# Patient Record
Sex: Female | Born: 1965 | Race: White | Hispanic: No | Marital: Married | Smoking: Never smoker
Health system: Southern US, Community
[De-identification: ages and names within clinical notes are randomized; demographics above are authoritative.]

## PROBLEM LIST (undated history)

## (undated) DIAGNOSIS — I10 Essential (primary) hypertension: Secondary | ICD-10-CM

---

## 2014-07-12 ENCOUNTER — Other Ambulatory Visit: Payer: Self-pay

## 2014-07-12 DIAGNOSIS — Z1231 Encounter for screening mammogram for malignant neoplasm of breast: Secondary | ICD-10-CM

## 2014-07-21 ENCOUNTER — Ambulatory Visit
Admission: RE | Admit: 2014-07-21 | Discharge: 2014-07-21 | Disposition: A | Discharge status: Home / Self Care | Payer: BC Managed Care – PPO | Source: Ambulatory Visit

## 2014-07-21 DIAGNOSIS — Z1231 Encounter for screening mammogram for malignant neoplasm of breast: Secondary | ICD-10-CM

## 2015-06-23 ENCOUNTER — Other Ambulatory Visit: Payer: Self-pay

## 2015-06-23 DIAGNOSIS — Z803 Family history of malignant neoplasm of breast: Secondary | ICD-10-CM

## 2015-06-23 DIAGNOSIS — Z1231 Encounter for screening mammogram for malignant neoplasm of breast: Secondary | ICD-10-CM

## 2015-08-02 ENCOUNTER — Ambulatory Visit
Admission: RE | Admit: 2015-08-02 | Discharge: 2015-08-02 | Disposition: A | Discharge status: Home / Self Care | Payer: BLUE CROSS/BLUE SHIELD | Source: Ambulatory Visit

## 2015-08-02 DIAGNOSIS — Z1231 Encounter for screening mammogram for malignant neoplasm of breast: Secondary | ICD-10-CM

## 2015-08-02 DIAGNOSIS — Z803 Family history of malignant neoplasm of breast: Secondary | ICD-10-CM

## 2016-06-25 ENCOUNTER — Other Ambulatory Visit: Payer: Self-pay | Admitting: Family Medicine

## 2016-06-25 DIAGNOSIS — Z1231 Encounter for screening mammogram for malignant neoplasm of breast: Secondary | ICD-10-CM

## 2016-07-12 ENCOUNTER — Encounter (HOSPITAL_COMMUNITY)
Admission: EM | Disposition: A | Discharge status: Home / Self Care | Payer: Self-pay | Source: Home / Self Care | Attending: Neurosurgery

## 2016-07-12 ENCOUNTER — Emergency Department (HOSPITAL_COMMUNITY): Payer: BLUE CROSS/BLUE SHIELD

## 2016-07-12 ENCOUNTER — Other Ambulatory Visit (HOSPITAL_COMMUNITY): Payer: Self-pay

## 2016-07-12 ENCOUNTER — Inpatient Hospital Stay (HOSPITAL_COMMUNITY)
Admission: EM | Admit: 2016-07-12 | Discharge: 2016-07-21 | DRG: 066 | Disposition: A | Discharge status: Home / Self Care | Payer: BLUE CROSS/BLUE SHIELD | Attending: Neurosurgery | Admitting: Neurosurgery

## 2016-07-12 ENCOUNTER — Inpatient Hospital Stay (HOSPITAL_COMMUNITY): Payer: BLUE CROSS/BLUE SHIELD

## 2016-07-12 ENCOUNTER — Encounter (HOSPITAL_COMMUNITY): Payer: Self-pay | Admitting: Nurse Practitioner

## 2016-07-12 ENCOUNTER — Inpatient Hospital Stay (HOSPITAL_COMMUNITY): Payer: BLUE CROSS/BLUE SHIELD | Admitting: Certified Registered Nurse Anesthetist

## 2016-07-12 DIAGNOSIS — R51 Headache: Secondary | ICD-10-CM | POA: Diagnosis present

## 2016-07-12 DIAGNOSIS — I10 Essential (primary) hypertension: Secondary | ICD-10-CM | POA: Diagnosis present

## 2016-07-12 DIAGNOSIS — S066X0A Traumatic subarachnoid hemorrhage without loss of consciousness, initial encounter: Secondary | ICD-10-CM

## 2016-07-12 DIAGNOSIS — I609 Nontraumatic subarachnoid hemorrhage, unspecified: Secondary | ICD-10-CM | POA: Diagnosis present

## 2016-07-12 HISTORY — PX: IR GENERIC HISTORICAL: IMG1180011

## 2016-07-12 HISTORY — DX: Essential (primary) hypertension: I10

## 2016-07-12 HISTORY — PX: RADIOLOGY WITH ANESTHESIA: SHX6223

## 2016-07-12 LAB — RAPID URINE DRUG SCREEN, HOSP PERFORMED
AMPHETAMINES: NOT DETECTED
Barbiturates: NOT DETECTED
Benzodiazepines: POSITIVE — AB
Cocaine: NOT DETECTED
Opiates: NOT DETECTED
Tetrahydrocannabinol: NOT DETECTED

## 2016-07-12 LAB — CBC
HCT: 38.1 % (ref 36.0–46.0)
HCT: 39.1 % (ref 36.0–46.0)
Hemoglobin: 13.6 g/dL (ref 12.0–15.0)
Hemoglobin: 13.5 g/dL (ref 12.0–15.0)
MCH: 33.3 pg (ref 26.0–34.0)
MCH: 32.5 pg (ref 26.0–34.0)
MCHC: 35.7 g/dL (ref 30.0–36.0)
MCHC: 34.5 g/dL (ref 30.0–36.0)
MCV: 93.2 fL (ref 78.0–100.0)
MCV: 94.2 fL (ref 78.0–100.0)
PLATELETS: 197 10*3/uL (ref 150–400)
PLATELETS: 194 10*3/uL (ref 150–400)
RBC: 4.09 MIL/uL (ref 3.87–5.11)
RBC: 4.15 MIL/uL (ref 3.87–5.11)
RDW: 13.4 % (ref 11.5–15.5)
RDW: 13.5 % (ref 11.5–15.5)
WBC: 8.7 10*3/uL (ref 4.0–10.5)
WBC: 10.6 10*3/uL — AB (ref 4.0–10.5)

## 2016-07-12 LAB — COMPREHENSIVE METABOLIC PANEL
ALT: 38 U/L (ref 14–54)
AST: 41 U/L (ref 15–41)
Albumin: 4.6 g/dL (ref 3.5–5.0)
Alkaline Phosphatase: 79 U/L (ref 38–126)
Anion gap: 13 (ref 5–15)
BUN: 15 mg/dL (ref 6–20)
CHLORIDE: 104 mmol/L (ref 101–111)
CO2: 22 mmol/L (ref 22–32)
CREATININE: 0.92 mg/dL (ref 0.44–1.00)
Calcium: 10.1 mg/dL (ref 8.9–10.3)
GFR calc non Af Amer: >60 mL/min (ref >60)
Glucose, Bld: 156 mg/dL — ABNORMAL HIGH (ref 65–99)
POTASSIUM: 3.2 mmol/L — AB (ref 3.5–5.1)
SODIUM: 139 mmol/L (ref 135–145)
Total Bilirubin: 0.5 mg/dL (ref 0.3–1.2)
Total Protein: 7.6 g/dL (ref 6.5–8.1)

## 2016-07-12 LAB — APTT: APTT: 28 s (ref 24–36)

## 2016-07-12 LAB — PROTIME-INR
INR: 1
Prothrombin Time: 13.2 seconds (ref 11.4–15.2)

## 2016-07-12 LAB — LIPASE, BLOOD: LIPASE: 39 U/L (ref 11–51)

## 2016-07-12 SURGERY — RADIOLOGY WITH ANESTHESIA
Anesthesia: Choice

## 2016-07-12 SURGERY — Surgical Case
Anesthesia: *Unknown

## 2016-07-12 MED ORDER — PHENYLEPHRINE 40 MCG/ML (10ML) SYRINGE FOR IV PUSH (FOR BLOOD PRESSURE SUPPORT)
PREFILLED_SYRINGE | INTRAVENOUS | Status: AC
  Filled 2016-07-12: qty 10

## 2016-07-12 MED ORDER — ACETAMINOPHEN 160 MG/5ML PO SOLN: 650.0000 mg | ORAL | Status: DC | PRN

## 2016-07-12 MED ORDER — NICARDIPINE HCL IN NACL 20-0.86 MG/200ML-% IV SOLN: 0.0000 mg/h | INTRAVENOUS | Status: DC

## 2016-07-12 MED ORDER — ACETAMINOPHEN-CODEINE #3 300-30 MG PO TABS
1.0000 | ORAL_TABLET | ORAL | Status: DC | PRN
  Administered 2016-07-12 – 2016-07-20 (×8): 2 via ORAL
  Filled 2016-07-12 (×8): qty 2

## 2016-07-12 MED ORDER — PANTOPRAZOLE SODIUM 40 MG PO PACK: 40.0000 mg | PACK | Freq: Every day | ORAL | Status: DC

## 2016-07-12 MED ORDER — PANTOPRAZOLE SODIUM 40 MG PO TBEC
40.0000 mg | DELAYED_RELEASE_TABLET | Freq: Every day | ORAL | Status: DC
  Administered 2016-07-12 – 2016-07-21 (×10): 40 mg via ORAL
  Filled 2016-07-12 (×10): qty 1

## 2016-07-12 MED ORDER — PROPOFOL 10 MG/ML IV BOLUS
INTRAVENOUS | Status: AC
  Filled 2016-07-12: qty 20

## 2016-07-12 MED ORDER — MORPHINE SULFATE (PF) 4 MG/ML IV SOLN: 1.0000 mg | INTRAVENOUS | Status: DC | PRN

## 2016-07-12 MED ORDER — LIDOCAINE 2% (20 MG/ML) 5 ML SYRINGE
INTRAMUSCULAR | Status: AC
  Filled 2016-07-12: qty 5

## 2016-07-12 MED ORDER — FENTANYL CITRATE (PF) 100 MCG/2ML IJ SOLN
INTRAMUSCULAR | Status: DC | PRN
  Administered 2016-07-12: 25 ug via INTRAVENOUS

## 2016-07-12 MED ORDER — ONDANSETRON HCL 4 MG/2ML IJ SOLN
4.0000 mg | Freq: Four times a day (QID) | INTRAMUSCULAR | Status: DC | PRN
Start: 2016-07-12 — End: 2016-07-21
  Administered 2016-07-13 – 2016-07-20 (×7): 4 mg via INTRAVENOUS
  Filled 2016-07-12 (×7): qty 2

## 2016-07-12 MED ORDER — SUCCINYLCHOLINE CHLORIDE 200 MG/10ML IV SOSY
PREFILLED_SYRINGE | INTRAVENOUS | Status: AC
  Filled 2016-07-12: qty 10

## 2016-07-12 MED ORDER — PROCHLORPERAZINE EDISYLATE 5 MG/ML IJ SOLN
10.0000 mg | Freq: Once | INTRAMUSCULAR | Status: AC
  Administered 2016-07-12: 10 mg via INTRAVENOUS
  Filled 2016-07-12: qty 2

## 2016-07-12 MED ORDER — IOPAMIDOL (ISOVUE-300) INJECTION 61%
INTRAVENOUS | Status: AC
  Administered 2016-07-12: 75 mL
  Filled 2016-07-12: qty 100

## 2016-07-12 MED ORDER — MIDAZOLAM HCL 2 MG/2ML IJ SOLN
INTRAMUSCULAR | Status: DC | PRN
  Administered 2016-07-12: 1 mg via INTRAVENOUS

## 2016-07-12 MED ORDER — ACETAMINOPHEN 325 MG PO TABS
650.0000 mg | ORAL_TABLET | ORAL | Status: DC | PRN
  Administered 2016-07-17 – 2016-07-18 (×2): 650 mg via ORAL
  Filled 2016-07-12 (×2): qty 2

## 2016-07-12 MED ORDER — NIMODIPINE 30 MG PO CAPS
60.0000 mg | ORAL_CAPSULE | ORAL | Status: DC
  Administered 2016-07-12 – 2016-07-21 (×51): 60 mg via ORAL
  Filled 2016-07-12 (×53): qty 2

## 2016-07-12 MED ORDER — ROCURONIUM BROMIDE 10 MG/ML (PF) SYRINGE
PREFILLED_SYRINGE | INTRAVENOUS | Status: AC
  Filled 2016-07-12: qty 10

## 2016-07-12 MED ORDER — NIMODIPINE 60 MG/20ML PO SOLN
60.0000 mg | ORAL | Status: DC
  Filled 2016-07-12 (×2): qty 20

## 2016-07-12 MED ORDER — IOPAMIDOL (ISOVUE-370) INJECTION 76%
INTRAVENOUS | Status: AC
  Administered 2016-07-12: 50 mL
  Filled 2016-07-12: qty 50

## 2016-07-12 MED ORDER — IOPAMIDOL (ISOVUE-300) INJECTION 61%
INTRAVENOUS | Status: AC
  Filled 2016-07-12: qty 150

## 2016-07-12 MED ORDER — HYDROCHLOROTHIAZIDE 12.5 MG PO CAPS
12.5000 mg | ORAL_CAPSULE | Freq: Every day | ORAL | Status: DC
  Administered 2016-07-13 – 2016-07-21 (×9): 12.5 mg via ORAL
  Filled 2016-07-12 (×9): qty 1

## 2016-07-12 MED ORDER — FENTANYL CITRATE (PF) 100 MCG/2ML IJ SOLN
50.0000 ug | Freq: Once | INTRAMUSCULAR | Status: AC
  Administered 2016-07-12: 50 ug via INTRAVENOUS
  Filled 2016-07-12: qty 2

## 2016-07-12 MED ORDER — LABETALOL HCL 5 MG/ML IV SOLN
20.0000 mg | Freq: Once | INTRAVENOUS | Status: AC
  Administered 2016-07-12: 20 mg via INTRAVENOUS
  Filled 2016-07-12: qty 4

## 2016-07-12 MED ORDER — STROKE: EARLY STAGES OF RECOVERY BOOK
Freq: Once | Status: DC
  Filled 2016-07-12: qty 1

## 2016-07-12 MED ORDER — EPHEDRINE 5 MG/ML INJ
INTRAVENOUS | Status: AC
  Filled 2016-07-12: qty 10

## 2016-07-12 MED ORDER — SODIUM CHLORIDE 0.9 % IV BOLUS (SEPSIS)
500.0000 mL | Freq: Once | INTRAVENOUS | Status: AC
  Administered 2016-07-12: 500 mL via INTRAVENOUS

## 2016-07-12 MED ORDER — SODIUM CHLORIDE 0.9 % IV SOLN
INTRAVENOUS | Status: DC
  Administered 2016-07-12: 100 mL/h via INTRAVENOUS
  Administered 2016-07-12 – 2016-07-13 (×4): via INTRAVENOUS
  Administered 2016-07-14: 1000 mL via INTRAVENOUS
  Administered 2016-07-14: via INTRAVENOUS
  Administered 2016-07-15: 1000 mL via INTRAVENOUS
  Administered 2016-07-16: via INTRAVENOUS
  Administered 2016-07-16: 1000 mL via INTRAVENOUS
  Administered 2016-07-18 – 2016-07-20 (×6): via INTRAVENOUS
  Administered 2016-07-21: 100 mL/h via INTRAVENOUS

## 2016-07-12 MED ORDER — DIPHENHYDRAMINE HCL 50 MG/ML IJ SOLN
12.5000 mg | Freq: Once | INTRAMUSCULAR | Status: AC
  Administered 2016-07-12: 12.5 mg via INTRAVENOUS
  Filled 2016-07-12: qty 1

## 2016-07-12 MED ORDER — STROKE: EARLY STAGES OF RECOVERY BOOK
Freq: Once | Status: AC
  Administered 2016-07-12: 22:00:00
  Filled 2016-07-12: qty 1

## 2016-07-12 MED ORDER — ONDANSETRON HCL 4 MG/2ML IJ SOLN
4.0000 mg | Freq: Once | INTRAMUSCULAR | Status: AC
  Administered 2016-07-12: 4 mg via INTRAVENOUS

## 2016-07-12 MED ORDER — LACTATED RINGERS IV SOLN: INTRAVENOUS | Status: DC | PRN

## 2016-07-12 MED ORDER — LIDOCAINE HCL 1 % IJ SOLN
INTRAMUSCULAR | Status: AC
  Filled 2016-07-12: qty 20

## 2016-07-12 MED ORDER — ONDANSETRON HCL 4 MG/2ML IJ SOLN
INTRAMUSCULAR | Status: AC
  Filled 2016-07-12: qty 2

## 2016-07-12 MED ORDER — LOSARTAN POTASSIUM-HCTZ 100-12.5 MG PO TABS: 1.0000 | ORAL_TABLET | Freq: Every day | ORAL | Status: DC

## 2016-07-12 MED ORDER — DOCUSATE SODIUM 100 MG PO CAPS
100.0000 mg | ORAL_CAPSULE | Freq: Two times a day (BID) | ORAL | Status: DC
  Administered 2016-07-12 – 2016-07-21 (×15): 100 mg via ORAL
  Filled 2016-07-12 (×16): qty 1

## 2016-07-12 MED ORDER — ONDANSETRON 4 MG PO TBDP
4.0000 mg | ORAL_TABLET | Freq: Four times a day (QID) | ORAL | Status: DC | PRN
  Filled 2016-07-12: qty 1

## 2016-07-12 MED ORDER — LIDOCAINE HCL 1 % IJ SOLN
INTRAMUSCULAR | Status: AC | PRN
  Administered 2016-07-12: 5 mL

## 2016-07-12 MED ORDER — LOSARTAN POTASSIUM 50 MG PO TABS
100.0000 mg | ORAL_TABLET | Freq: Every day | ORAL | Status: DC
  Administered 2016-07-13 – 2016-07-21 (×9): 100 mg via ORAL
  Filled 2016-07-12 (×10): qty 2

## 2016-07-12 NOTE — ED Notes (Signed)
Neruosurgery at bedside

## 2016-07-12 NOTE — ED Notes (Signed)
Bed: WA06 Expected date:  Expected time:  Means of arrival:  Comments: 50 yo nausea/vomiting

## 2016-07-12 NOTE — Anesthesia Procedure Notes (Signed)
Procedure Name: MAC Date/Time: 07/12/2016 7:57 PM Performed by: Orvilla FusATO, Nela Bascom A Oxygen Delivery Method: Nasal cannula

## 2016-07-12 NOTE — ED Triage Notes (Addendum)
Eating reheated chinese food and started vomiting. EMS gave 4mg  of zofran prior to arrival

## 2016-07-12 NOTE — Transfer of Care (Signed)
Immediate Anesthesia Transfer of Care Note  Patient: Sherren KernsJennifer Kozub  Procedure(s) Performed: Procedure(s): RADIOLOGY WITH ANESTHESIA (N/A)  Patient Location: NICU  Anesthesia Type:MAC  Level of Consciousness: awake, alert  and oriented  Airway & Oxygen Therapy: Patient Spontanous Breathing  Post-op Assessment: Report given to RN, Post -op Vital signs reviewed and stable and Patient moving all extremities  Post vital signs: Reviewed and stable  Last Vitals:  Vitals:   07/12/16 1828 07/12/16 1900  BP:  168/100  Pulse:  79  Resp:  19  Temp: 37 C     Last Pain: There were no vitals filed for this visit.       Complications: No apparent anesthesia complications

## 2016-07-12 NOTE — Progress Notes (Signed)
Dr. Knapp in room with pt 

## 2016-07-12 NOTE — ED Notes (Signed)
[facility] here to get [facility]. Patient stable at this time. Report callled to Charge Nurse [facility]

## 2016-07-12 NOTE — H&P (Signed)
Carolyn Sharp is an 50 y.o. female.   Chief Complaint: headache, nausea, vomiting HPI: Carolyn Sharp was in her usual state of health when earlier today she reports a headache, nausea and subsequent emesis. Taken by ambulance to [facility], a head ct showed a large amount of subarachnoid blood.   Past Medical History:  Diagnosis Date  . Hypertension     No past surgical history on file.  No family history on file. Social History:  reports that she has never smoked. She has never used smokeless tobacco. She reports that she drinks about 4.2 oz of alcohol per week . She reports that she does not use drugs.  Allergies: No Known Allergies   (Not in a hospital admission)  Results for orders placed or performed during the hospital encounter of 07/12/16 (from the past 48 hour(s))  CBC     Status: None   Collection Time: 07/12/16  3:09 PM  Result Value Ref Range   WBC 8.7 4.0 - 10.5 K/uL   RBC 4.09 3.87 - 5.11 MIL/uL   Hemoglobin 13.6 12.0 - 15.0 g/dL   HCT 38.1 36.0 - 46.0 %   MCV 93.2 78.0 - 100.0 fL   MCH 33.3 26.0 - 34.0 pg   MCHC 35.7 30.0 - 36.0 g/dL   RDW 13.4 11.5 - 15.5 %   Platelets 197 150 - 400 K/uL  Comprehensive metabolic panel     Status: Abnormal   Collection Time: 07/12/16  3:09 PM  Result Value Ref Range   Sodium 139 135 - 145 mmol/L   Potassium 3.2 (L) 3.5 - 5.1 mmol/L   Chloride 104 101 - 111 mmol/L   CO2 22 22 - 32 mmol/L   Glucose, Bld 156 (H) 65 - 99 mg/dL   BUN 15 6 - 20 mg/dL   Creatinine, Ser 0.92 0.44 - 1.00 mg/dL   Calcium 10.1 8.9 - 10.3 mg/dL   Total Protein 7.6 6.5 - 8.1 g/dL   Albumin 4.6 3.5 - 5.0 g/dL   AST 41 15 - 41 U/L   ALT 38 14 - 54 U/L   Alkaline Phosphatase 79 38 - 126 U/L   Total Bilirubin 0.5 0.3 - 1.2 mg/dL   GFR calc non Af Amer >60 >60 mL/min   GFR calc Af Amer >60 >60 mL/min    Comment: (NOTE) The eGFR has been calculated using the CKD EPI equation. This calculation has not been validated in all clinical  situations. eGFR's persistently <60 mL/min signify possible Chronic Kidney Disease.    Anion gap 13 5 - 15  Lipase, blood     Status: None   Collection Time: 07/12/16  3:09 PM  Result Value Ref Range   Lipase 39 11 - 51 U/L   Ct Angio Head W Or Wo Contrast  Result Date: 07/12/2016 CLINICAL DATA:  Subarachnoid hemorrhage EXAM: CT ANGIOGRAPHY HEAD TECHNIQUE: Multidetector CT imaging of the head was performed using the standard protocol during bolus administration of intravenous contrast. Multiplanar CT image reconstructions and MIPs were obtained to evaluate the vascular anatomy. CONTRAST:  50 mL Isovue 370 IV COMPARISON:  None. FINDINGS: CTA HEAD Anterior circulation: Cavernous carotid widely patent. No significant atherosclerotic disease. Fetal origin of the posterior cerebral artery bilaterally. Negative for cavernous aneurysm. Anterior and middle cerebral arteries are normal without stenosis. Negative for aneurysm. Posterior circulation: Both vertebral arteries contribute to the basilar. PICA patent bilaterally. Right AICA patent. Basilar widely patent and hypoplastic distally. Superior cerebellar and posterior cerebral  arteries appear normal with. No aneurysm in the posterior circulation. Venous sinuses: Patent Anatomic variants: None Delayed phase: Diffuse subarachnoid hemorrhage which is symmetric and centered around the brainstem and suprasellar cistern. This also extends into the sylvian fissure bilaterally. This is unchanged from the CT earlier today. Early hydrocephalus again noted. IMPRESSION: Normal CTA head. No evidence of cerebral aneurysm or other cause for subarachnoid hemorrhage Moderate subarachnoid hemorrhage, greater than expected for venous hemorrhage. Close clinical follow-up and follow-up CTA recommended. Early hydrocephalus unchanged I reviewed the images with Dr. Cyndy Freeze. Electronically Signed   By: Franchot Gallo M.D.   On: 07/12/2016 17:18   Ct Head Wo Contrast  Result Date:  07/12/2016 CLINICAL DATA:  Acute onset headache with vomiting. EXAM: CT HEAD WITHOUT CONTRAST TECHNIQUE: Contiguous axial images were obtained from the base of the skull through the vertex without intravenous contrast. COMPARISON:  None. FINDINGS: Brain: There is subarachnoid hemorrhage extending throughout the suprasellar cistern region and into the surrounding cisternal regions. A small amount of subarachnoid hemorrhage extends to the left as far as the sylvian fissure. There is borderline ventricular prominence. There is no midline shift. There is no subdural or epidural fluid. There is no mass or edema evident. Vascular: The location of the hemorrhage suggests aneurysm, possibly arising from the basilar tip. Pilar Plate aneurysm is not delineated on this noncontrast enhanced study. Skull: The bony calvarium appears intact. Sinuses/Orbits: Visualized paranasal sinuses are clear. Visualized orbits appear symmetric bilaterally. Other: Mastoid air cells clear. IMPRESSION: Extensive subarachnoid hemorrhage. 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 Suspect early hydrocephalus. Critical Value/emergent results were called by telephone at the time of interpretation on 07/12/2016 at 3:22 pm to Dr. Dorie Rank , who verbally acknowledged these results. Electronically Signed   By: Lowella Grip III M.D.   On: 07/12/2016 15:24    Review of Systems  Constitutional: Negative.   HENT:       Headache  Eyes: Negative.   Respiratory: Negative.   Cardiovascular: Negative.   Gastrointestinal: Positive for nausea and vomiting.  Genitourinary: Negative.   Musculoskeletal: Negative.   Skin: Negative.   Neurological: Positive for headaches.  Endo/Heme/Allergies: Negative.   Psychiatric/Behavioral: Negative.     Blood pressure 184/94, pulse 77, temperature 98.6 F (37 C), resp. rate 17, height _0  (1.575 m), weight 63.5 kg (140 lb), SpO2 100 %. Physical Exam  Constitutional: She is oriented  to person, place, and time. She appears well-developed and well-nourished. She appears distressed.  HENT:  Head: Normocephalic and atraumatic.  Nose: Nose normal.  Mouth/Throat: Oropharynx is clear and moist.  Eyes: Conjunctivae and EOM are normal. Pupils are equal, round, and reactive to light.  Neck: Normal range of motion. Neck supple.  Cardiovascular: Normal rate, regular rhythm and normal heart sounds.   Respiratory: Effort normal and breath sounds normal.  GI: Soft.  Musculoskeletal: Normal range of motion.  Neurological: She is alert and oriented to person, place, and time. She has normal strength and normal reflexes. She displays normal reflexes. No cranial nerve deficit or sensory deficit. She exhibits normal muscle tone. Coordination normal. GCS eye subscore is 4. GCS verbal subscore is 5. GCS motor subscore is 6. She displays no Babinski's sign on the right side. She displays no Babinski's sign on the left side.  Reflex Scores:      Tricep reflexes are 2+ on the right side and 2+ on the left side.      Bicep reflexes are 2+ on  the right side and 2+ on the left side.      Brachioradialis reflexes are 2+ on the right side and 2+ on the left side.      Patellar reflexes are 2+ on the right side and 2+ on the left side.      Achilles reflexes are 2+ on the right side and 2+ on the left side. Alert and oriented x 4, speech is clear, fluent Moving all extremities well   Skin: Skin is warm and dry.  Psychiatric: She has a normal mood and affect. Her behavior is normal. Judgment and thought content normal.   has some repetition asking the same question and hearing the same explanation. Appropriate conversation.   Assessment/Plan Diagnostic angiogram tonight. CTA did not show an aneurysm. However due to the amount of blood I believe that a 4 vessel angiogram is warranted. She is doing well. Will admit to the ICU for close observation.  Non anuerysmal hemorrhage does occur in  20% of all  SAH. She is grade 1 on the Hunt Hess scale.   Eduardo Honor L, MD 07/12/2016, 5:41 PM

## 2016-07-12 NOTE — Anesthesia Preprocedure Evaluation (Signed)
Anesthesia Evaluation  Patient identified by MRN, date of birth, ID band Patient awake    Reviewed: Allergy & Precautions, NPO status , Patient's Chart, lab work & pertinent test results  Airway Mallampati: I  TM Distance: >3 FB Neck ROM: Full    Dental   Pulmonary    Pulmonary exam normal        Cardiovascular hypertension, Pt. on medications Normal cardiovascular exam     Neuro/Psych    GI/Hepatic   Endo/Other    Renal/GU      Musculoskeletal   Abdominal   Peds  Hematology   Anesthesia Other Findings   Reproductive/Obstetrics                             Anesthesia Physical Anesthesia Plan  ASA: III and emergent  Anesthesia Plan: MAC   Post-op Pain Management:    Induction: Intravenous  Airway Management Planned:   Additional Equipment:   Intra-op Plan:   Post-operative Plan:   Informed Consent: I have reviewed the patients History and Physical, chart, labs and discussed the procedure including the risks, benefits and alternatives for the proposed anesthesia with the patient or authorized representative who has indicated his/her understanding and acceptance.     Plan Discussed with: CRNA and Surgeon  Anesthesia Plan Comments:         Anesthesia Quick Evaluation

## 2016-07-12 NOTE — ED Provider Notes (Signed)
[facility]-EMERGENCY DEPT Provider Note   CSN: [id number] Arrival date & time: 07/12/16  1334     History   Chief Complaint No chief complaint on file.   HPI Carolyn Sharp is a 50 y.o. female.  Pt was at home.  She felt well initially.  Suddenly while eating some food, she started having nausea, vomiting,   She at the same food yesterday.  She also developed a severe headache in the back of her head.  Her neck was sore as well.    No photophobia.  No numbness or weakness.  No history of having trouble with headaches in the past.   The history is provided by the patient.  Emesis   This is a new problem. The current episode started 1 to 2 hours ago. The problem occurs 5 to 10 times per day. The problem has not changed since onset.There has been no fever. Associated symptoms include chills and sweats. Pertinent negatives include no fever.    Past Medical History:  Diagnosis Date  . Hypertension     There are no active problems to display for this patient.   No past surgical history on file.  OB History    No data available       Home Medications    Prior to Admission medications   Medication Sig Start Date End Date Taking? Authorizing Provider  acetaminophen (TYLENOL) 325 MG tablet Take 650 mg by mouth every 6 (six) hours as needed (For pain.).   Yes Historical Provider, MD  losartan-hydrochlorothiazide (HYZAAR) 100-12.5 MG tablet Take 1 tablet by mouth daily. 07/04/16  Yes Historical Provider, MD    Family History No family history on file.  Social History Social History  Substance Use Topics  . Smoking status: Never Smoker  . Smokeless tobacco: Never Used  . Alcohol use 4.2 oz/week    7 Glasses of wine per week     Allergies   Patient has no known allergies.   Review of Systems Review of Systems  Constitutional: Positive for chills. Negative for fever.  Gastrointestinal: Positive for vomiting.  All other systems reviewed and are  negative.    Physical Exam Updated Vital Signs BP 162/78   Pulse 88   Resp 20   Ht 5\' 2"  (1.575 m)   Wt 63.5 kg   BMI 25.61 kg/m   Physical Exam  Constitutional: She is oriented to person, place, and time. She appears well-developed and well-nourished. No distress.  HENT:  Head: Normocephalic and atraumatic.  Right Ear: External ear normal.  Left Ear: External ear normal.  Mouth/Throat: Oropharynx is clear and moist.  Eyes: Conjunctivae and EOM are normal. Pupils are equal, round, and reactive to light. Right eye exhibits no discharge. Left eye exhibits no discharge. No scleral icterus.  Neck: Neck supple. No tracheal deviation present.  Pain with flexion of the neck   Cardiovascular: Normal rate, regular rhythm and intact distal pulses.   Pulmonary/Chest: Effort normal and breath sounds normal. No stridor. No respiratory distress. She has no wheezes. She has no rales.  Abdominal: Soft. Bowel sounds are normal. She exhibits no distension. There is no tenderness. There is no rebound and no guarding.  Musculoskeletal: She exhibits no edema or tenderness.  Neurological: She is alert and oriented to person, place, and time. She has normal strength. No cranial nerve deficit (No facial droop, extraocular movements intact, tongue midline ) or sensory deficit. She exhibits normal muscle tone. She displays no seizure activity. Coordination normal.  No pronator drift bilateral upper extrem, able to hold both legs off bed for 5 seconds, sensation intact in all extremities, no visual field cuts, no left or right sided neglect, , no nystagmus noted   Skin: Skin is warm and dry. No rash noted.  Psychiatric: She has a normal mood and affect.  Nursing note and vitals reviewed.    ED Treatments / Results  Labs (all labs ordered are listed, but only abnormal results are displayed) Labs Reviewed  COMPREHENSIVE METABOLIC PANEL - Abnormal; Notable for the following:       Result Value    Potassium 3.2 (*)    Glucose, Bld 156 (*)    All other components within normal limits  CBC  LIPASE, BLOOD  URINALYSIS, ROUTINE W REFLEX MICROSCOPIC (NOT AT [facility])     Radiology Ct Head Wo Contrast  Result Date: 07/12/2016 CLINICAL DATA:  Acute onset headache with vomiting. EXAM: CT HEAD WITHOUT CONTRAST TECHNIQUE: Contiguous axial images were obtained from the base of the skull through the vertex without intravenous contrast. COMPARISON:  None. FINDINGS: Brain: There is subarachnoid hemorrhage extending throughout the suprasellar cistern region and into the surrounding cisternal regions. A small amount of subarachnoid hemorrhage extends to the left as far as the sylvian fissure. There is borderline ventricular prominence. There is no midline shift. There is no subdural or epidural fluid. There is no mass or edema evident. Vascular: The location of the hemorrhage suggests aneurysm, possibly arising from the basilar tip. Homero FellersFrank aneurysm is not delineated on this noncontrast enhanced study. Skull: The bony calvarium appears intact. Sinuses/Orbits: Visualized paranasal sinuses are clear. Visualized orbits appear symmetric bilaterally. Other: Mastoid air cells clear. IMPRESSION: Extensive subarachnoid hemorrhage. 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 Suspect early hydrocephalus. Critical Value/emergent results were called by telephone at the time of interpretation on 07/12/2016 at 3:22 pm to Dr. Linwood DibblesJON Agapita Savarino , who verbally acknowledged these results. Electronically Signed   By: Bretta BangWilliam  Woodruff III M.D.   On: 07/12/2016 15:24    Procedures .Critical Care Performed by: Linwood DibblesKNAPP, Marrisa Kimber Authorized by: Linwood DibblesKNAPP, Mandeep Kiser   Critical care provider statement:    Critical care time (minutes):  35   Critical care was necessary to treat or prevent imminent or life-threatening deterioration of the following conditions:  CNS failure or compromise   Critical care was time spent personally by me on  the following activities:  Discussions with consultants, evaluation of patient's response to treatment, examination of patient, ordering and performing treatments and interventions, ordering and review of laboratory studies, ordering and review of radiographic studies, pulse oximetry, re-evaluation of patient's condition, obtaining history from patient or surrogate and review of old charts   (including critical care time)  Medications Ordered in ED Medications  fentaNYL (SUBLIMAZE) injection 50 mcg (not administered)  ondansetron (ZOFRAN) injection 4 mg ( Intravenous Not Given 07/12/16 1432)  sodium chloride 0.9 % bolus 500 mL (500 mLs Intravenous New Bag/Given 07/12/16 1509)  prochlorperazine (COMPAZINE) injection 10 mg (10 mg Intravenous Given 07/12/16 1506)  diphenhydrAMINE (BENADRYL) injection 12.5 mg (12.5 mg Intravenous Given 07/12/16 1506)     Initial Impression / Assessment and Plan / ED Course  I have reviewed the triage vital signs and the nursing notes.  Pertinent labs & imaging results that were available during my care of the patient were reviewed by me and considered in my medical decision making (see chart for details).  Clinical Course as of Jul 12 1550  Thu Jul 12, 2016  1521 Preliminary review of the CT scan suggests SAH.  Formal read is pending.  [JK]  1547 Discussed with Dr Franky Machoabbell.  Will CT angio head.  [facility], hospitalization.  [JK]  1548 Neuro status unchanged. Patient remains alert and oriented. She feels better with her eyes closed but opens them quickly as soon as I speak to her.  [JK]    Clinical Course User Index [JK] Linwood DibblesJon Maraya Gwilliam, MD    Patient's symptoms were concerning for possible subarachnoid hemorrhage. The CT scan does confirm that. Her mental status has remained stable in the emergency room. I spoke with Dr. Mikal Planeabell who is on-call for neurosurgery. We [facility] the [facility].  I have ordered a head CT angiogram. [facility] will [facility] depending on her transport time.  Final Clinical Impressions(s) / ED Diagnoses   Final diagnoses:  Subarachnoid hematoma, without loss of consciousness, initial encounter (HCC)      Linwood DibblesJon Juliocesar Blasius, MD 07/12/16 1551

## 2016-07-12 NOTE — ED Notes (Signed)
Last BP from [facility] was 180/104

## 2016-07-12 NOTE — ED Notes (Signed)
Pt arrived via [facility] and straight to CT 1 for angio. Dr Sueanne Margaritaabbell's office called to make aware pt is here

## 2016-07-12 NOTE — Anesthesia Postprocedure Evaluation (Signed)
Anesthesia Post Note  Patient: Carolyn Sharp  Procedure(s) Performed: Procedure(s) (LRB): RADIOLOGY WITH ANESTHESIA (N/A)  Patient location during evaluation: PACU Anesthesia Type: General Level of consciousness: awake and alert Pain management: pain level controlled Vital Signs Assessment: post-procedure vital signs reviewed and stable Respiratory status: spontaneous breathing, nonlabored ventilation, respiratory function stable and patient connected to nasal cannula oxygen Cardiovascular status: blood pressure returned to baseline and stable Postop Assessment: no signs of nausea or vomiting Anesthetic complications: no    Last Vitals:  Vitals:   07/12/16 2130 07/12/16 2200  BP: (!) 143/86 (!) 149/80  Pulse: 78 84  Resp: 15 14  Temp:      Last Pain:  Vitals:   07/12/16 2045  TempSrc:   PainSc: 5                  [place] DAVID

## 2016-07-13 ENCOUNTER — Encounter (HOSPITAL_COMMUNITY): Payer: Self-pay | Admitting: Radiology

## 2016-07-13 ENCOUNTER — Inpatient Hospital Stay (HOSPITAL_COMMUNITY): Payer: BLUE CROSS/BLUE SHIELD

## 2016-07-13 DIAGNOSIS — I609 Nontraumatic subarachnoid hemorrhage, unspecified: Secondary | ICD-10-CM

## 2016-07-13 LAB — MRSA PCR SCREENING: MRSA BY PCR: NEGATIVE

## 2016-07-13 NOTE — Progress Notes (Signed)
PT Cancellation Note  Patient Details Name: Sherren KernsJennifer Goya MRN: [id number] DOB: 02/19/1966   Cancelled Treatment:    Reason Eval/Treat Not Completed: Patient not medically ready.  Pt currently on bedrest.  Please advance activity orders once appropriate for PT and mobility.     Sunny SchleinRitenour, Carlissa Pesola F, [place] CarolinaPT [phone] 07/13/2016, 8:28 AM

## 2016-07-13 NOTE — Evaluation (Signed)
Speech Language Pathology Evaluation Patient Details Name: Sherren KernsJennifer Peral MRN: [id number] DOB: 09/17/1965 Today's Date: 07/13/2016 Time: [id number] SLP Time Calculation (min) (ACUTE ONLY): 17 min  Problem List:  Patient Active Problem List   Diagnosis Date Noted  . Subarachnoid hemorrhage (HCC) 07/12/2016   Past Medical History:  Past Medical History:  Diagnosis Date  . Hypertension    Past Surgical History: No past surgical history on file. HPI:  50 yr old admitted after developing headache, nausea and subsequent emesis. CT showed a large amount of subarachnoid blood, suspect ruptured aneurysm, possibly at the level of the basilar tip. Suspect early hydrocephalus.   Assessment / Plan / Recommendation Clinical Impression  Pt currently with headache and nauseated. Given MOCA assessment (blind) primarily due to nausea with visual stimuli. She reports baseline difficulty with memory and "writes everything down." Memory impairments led to score of 17/22 as she was unable to recall any of the 5 words after 4 -5 min delay ( 3/5 correct with category cue; 1/5 correct with choice; 1/5 incorrect with choice). She recalled several details of treatment plan (moving out to floor, PT coming to see). SLP educated pt re: memory compensatory strategies. She is currently not working while in the [place]. SLP not planning to see pt for cognition however educated on outpatient ST if she obseres memory worsening or difficulty once home. Pt in agreement with suggestions.      SLP Assessment  Patient does not need any further Speech Lanaguage Pathology Services    Follow Up Recommendations  None    Frequency and Duration           SLP Evaluation Cognition  Overall Cognitive Status: History of cognitive impairments - at baseline (difficulty with memory) Arousal/Alertness: Awake/alert Orientation Level: Oriented to person;Oriented to place;Oriented to situation;Oriented to time (thought today was  [place]) Attention: Sustained Sustained Attention: Appears intact Memory: Impaired Memory Impairment: Storage deficit;Retrieval deficit Awareness: Appears intact Problem Solving: Appears intact Safety/Judgment: Appears intact       Comprehension  Auditory Comprehension Overall Auditory Comprehension: Appears within functional limits for tasks assessed Interfering Components: Pain Visual Recognition/Discrimination Discrimination: Not tested Reading Comprehension Reading Status: Within funtional limits    Expression Expression Primary Mode of Expression: Verbal Verbal Expression Overall Verbal Expression: Appears within functional limits for tasks assessed Pragmatics: No impairment Written Expression Dominant Hand: Right Written Expression: Not tested   Oral / Motor  Oral Motor/Sensory Function Overall Oral Motor/Sensory Function: Within functional limits Motor Speech Overall Motor Speech: Appears within functional limits for tasks assessed Intelligibility: Intelligible Motor Planning: Witnin functional limits   GO                    Royce MacadamiaLitaker, Shaniah Baltes Willis 07/13/2016, 11:06 AM  Breck CoonsLisa Willis Lonell FaceLitaker M.Ed [employer]-SLP Pager [phone]

## 2016-07-13 NOTE — Progress Notes (Signed)
OT Cancellation Note  Patient Details Name: Carolyn KernsJennifer Sharp MRN: [id number] DOB: 02/06/1966   Cancelled Treatment:    Reason Eval/Treat Not Completed: Patient not medically ready Pt on bedrest. Please update activity orders when pt appropriate for therapy.  [facility] CenterWARD,HILLARY  Danyael Alipio, OTR/L  [phone] 07/13/2016 07/13/2016, 7:53 AM

## 2016-07-13 NOTE — Progress Notes (Signed)
Transcranial Doppler  Date POD PCO2 HCT BP  MCA ACA PCA OPHT SIPH VERT Basilar  11//10 RDS/MS    131/72 Right  Left   52  63   -47  -21   55  27   21  18   31   *   -33  -20   -34           Right  Left                                            Right  Left                                             Right  Left                                             Right  Left                                            Right  Left                                            Right  Left                                        MCA = Middle Cerebral Artery      OPHT = Opthalmic Artery     BASILAR = Basilar Artery   ACA = Anterior Cerebral Artery     SIPH = Carotid Siphon PCA = Posterior Cerebral Artery   VERT = Verterbral Artery                   Normal MCA = 62+\-12 ACA = 50+\-12 PCA = 42+\-23   07/13/16-*- unable to insonate  right  Lindegaard ratio = 1.2, left = 2.3  Carolyn Sharp, BS, RDMS, RVT and [employer]Michelle Simonetti, RVT

## 2016-07-13 NOTE — Progress Notes (Signed)
Patient ID: Sherren KernsJennifer Eddington, female   DOB: 10/18/1965, 50 y.o.   MRN: [id number] BP 128/68   Pulse 75   Temp 99.1 F (37.3 C) (Oral)   Resp (!) 22   Ht 5\' 2"  (1.575 m)   Wt 63.6 kg (140 lb 3.4 oz)   SpO2 95%   BMI 25.65 kg/m  Alert and oriented x 4, speech is clear , fluent Perrl, full eom Symmetric facies, tongue and uvula midline Moving all extremities well Headache is better today Will continue with hydration, nimodipine. Angio last night was clean, no aneurysm identified.

## 2016-07-13 NOTE — Evaluation (Signed)
Physical Therapy Evaluation and Discharge Patient Details Name: Carolyn KernsJennifer Sharp MRN: [id number] DOB: 10/30/1965 Today's Date: 07/13/2016   History of Present Illness  pt presents with sudden onset headache with nausea and vomiting.  pt found to have Bil SAH symmetrically around brainstem with early hydrocephalus.  pt with hx of HTN.    Clinical Impression  Pt admits fatigue, but otherwise states she feels as though she is getting back to normal.  Pt able to ambulate independently without and LOB or deficits.  Pt able to perform gait with speed changes and head turns without difficulty.  No further PT needs at this time, will sign off.      Follow Up Recommendations No PT follow up;Supervision - Intermittent    Equipment Recommendations  None recommended by PT    Recommendations for Other Services       Precautions / Restrictions Precautions Precautions: None Restrictions Weight Bearing Restrictions: No      Mobility  Bed Mobility Overal bed mobility: Independent                Transfers Overall transfer level: Independent Equipment used: None                Ambulation/Gait Ambulation/Gait assistance: Independent Ambulation Distance (Feet): 300 Feet Assistive device: None Gait Pattern/deviations: WFL(Within Functional Limits)     General Gait Details: pt initially moving slow, but quickly assumes a more normal gait pattern.  pt indicates this is the first time up walking.  No LOB or deficits noted.  pt indicates feeling like she is at her baseline.    Stairs            Wheelchair Mobility    Modified Rankin (Stroke Patients Only) Modified Rankin (Stroke Patients Only) Pre-Morbid Rankin Score: No symptoms Modified Rankin: No significant disability     Balance Overall balance assessment: Independent                                           Pertinent Vitals/Pain Pain Assessment: 0-10 Pain Score: 3  Faces Pain Scale:  Hurts little more Pain Location: headache Pain Descriptors / Indicators: Headache Pain Intervention(s): Monitored during session;Premedicated before session;Repositioned    Home Living Family/patient expects to be discharged to:: Private residence Living Arrangements: Spouse/significant other Available Help at Discharge: Family;Available PRN/intermittently Type of Home: House Home Access: Stairs to enter   Entrance Stairs-Number of Steps: 3 Home Layout: Two level;Able to live on main level with bedroom/bathroom Home Equipment: None      Prior Function Level of Independence: Independent               Hand Dominance   Dominant Hand: Right    Extremity/Trunk Assessment   Upper Extremity Assessment: Overall WFL for tasks assessed           Lower Extremity Assessment: Overall WFL for tasks assessed      Cervical / Trunk Assessment: Normal  Communication   Communication: No difficulties  Cognition Arousal/Alertness: Awake/alert Behavior During Therapy: WFL for tasks assessed/performed Overall Cognitive Status: History of cognitive impairments - at baseline (memory difficulties at baseline.  )                      General Comments      Exercises     Assessment/Plan    PT Assessment Patent does not need any  further PT services  PT Problem List            PT Treatment Interventions      PT Goals (Current goals can be found in the Care Plan section)  Acute Rehab PT Goals Patient Stated Goal: Get back to normal PT Goal Formulation: All assessment and education complete, DC therapy    Frequency     Barriers to discharge        Co-evaluation               End of Session Equipment Utilized During Treatment: Gait belt Activity Tolerance: Patient tolerated treatment well Patient left: in chair;with call bell/phone within reach Nurse Communication: Mobility status         Time: [id number] PT Time Calculation (min) (ACUTE ONLY): 14  min   Charges:   PT Evaluation $PT Eval Low Complexity: 1 Procedure     PT G CodesSunny Schlein:        Nayara Taplin F, [place]PT [phone] 07/13/2016, 1:47 PM

## 2016-07-14 NOTE — Progress Notes (Signed)
Patient ID: Sherren KernsJennifer Arceneaux, female   DOB: 08/21/1966, 50 y.o.   MRN: [id number] Subjective: Patient reports mild headache, no NTW  Objective: Vital signs in last 24 hours: Temp:  [97.8 F (36.6 C)-99.1 F (37.3 C)] 98.7 F (37.1 C) (11/11 0400) Pulse Rate:  [51-86] 77 (11/11 0400) Resp:  [11-24] 14 (11/11 0400) BP: (114-157)/(66-83) 144/73 (11/11 0400) SpO2:  [94 %-100 %] 97 % (11/11 0400)  Intake/Output from previous day: 11/10 0701 - 11/11 0700 In: 2100 [I.V.:2100] Out: 2450 [Urine:2450] Intake/Output this shift: Total I/O In: 900 [I.V.:900] Out: 1000 [Urine:1000]  Neurologic: Grossly normal  Lab Results: Lab Results  Component Value Date   WBC 10.6 (H) 07/12/2016   HGB 13.5 07/12/2016   HCT 39.1 07/12/2016   MCV 94.2 07/12/2016   PLT 194 07/12/2016   Lab Results  Component Value Date   INR 1.00 07/12/2016   BMET Lab Results  Component Value Date   NA 139 07/12/2016   K 3.2 (L) 07/12/2016   CL 104 07/12/2016   CO2 22 07/12/2016   GLUCOSE 156 (H) 07/12/2016   BUN 15 07/12/2016   CREATININE 0.92 07/12/2016   CALCIUM 10.1 07/12/2016    Studies/Results: Ct Angio Head W Or Wo Contrast  Result Date: 07/12/2016 CLINICAL DATA:  Subarachnoid hemorrhage EXAM: CT ANGIOGRAPHY HEAD TECHNIQUE: Multidetector CT imaging of the head was performed using the standard protocol during bolus administration of intravenous contrast. Multiplanar CT image reconstructions and MIPs were obtained to evaluate the vascular anatomy. CONTRAST:  50 mL Isovue 370 IV COMPARISON:  None. FINDINGS: CTA HEAD Anterior circulation: Cavernous carotid widely patent. No significant atherosclerotic disease. Fetal origin of the posterior cerebral artery bilaterally. Negative for cavernous aneurysm. Anterior and middle cerebral arteries are normal without stenosis. Negative for aneurysm. Posterior circulation: Both vertebral arteries contribute to the basilar. PICA patent bilaterally. Right AICA patent.  Basilar widely patent and hypoplastic distally. Superior cerebellar and posterior cerebral arteries appear normal with. No aneurysm in the posterior circulation. Venous sinuses: Patent Anatomic variants: None Delayed phase: Diffuse subarachnoid hemorrhage which is symmetric and centered around the brainstem and suprasellar cistern. This also extends into the sylvian fissure bilaterally. This is unchanged from the CT earlier today. Early hydrocephalus again noted. IMPRESSION: Normal CTA head. No evidence of cerebral aneurysm or other cause for subarachnoid hemorrhage Moderate subarachnoid hemorrhage, greater than expected for venous hemorrhage. Close clinical follow-up and follow-up CTA recommended. Early hydrocephalus unchanged I reviewed the images with Dr. Mikal Planeabell. Electronically Signed   By: Marlan Palauharles  Clark M.D.   On: 07/12/2016 17:18   Ct Head Wo Contrast  Result Date: 07/12/2016 CLINICAL DATA:  Acute onset headache with vomiting. EXAM: CT HEAD WITHOUT CONTRAST TECHNIQUE: Contiguous axial images were obtained from the base of the skull through the vertex without intravenous contrast. COMPARISON:  None. FINDINGS: Brain: There is subarachnoid hemorrhage extending throughout the suprasellar cistern region and into the surrounding cisternal regions. A small amount of subarachnoid hemorrhage extends to the left as far as the sylvian fissure. There is borderline ventricular prominence. There is no midline shift. There is no subdural or epidural fluid. There is no mass or edema evident. Vascular: The location of the hemorrhage suggests aneurysm, possibly arising from the basilar tip. Homero FellersFrank aneurysm is not delineated on this noncontrast enhanced study. Skull: The bony calvarium appears intact. Sinuses/Orbits: Visualized paranasal sinuses are clear. Visualized orbits appear symmetric bilaterally. Other: Mastoid air cells clear. IMPRESSION: Extensive subarachnoid hemorrhage. 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 Suspect early hydrocephalus. Critical Value/emergent results were called by telephone at the time of interpretation on 07/12/2016 at 3:22 pm to Dr. Linwood DibblesJON KNAPP , who verbally acknowledged these results. Electronically Signed   By: Bretta BangWilliam  Woodruff III M.D.   On: 07/12/2016 15:24    Assessment/Plan: Doing well   LOS: 2 days    JONES,DAVID S 07/14/2016, 5:33 AM

## 2016-07-15 MED ORDER — ENALAPRILAT 1.25 MG/ML IV SOLN
1.2500 mg | Freq: Once | INTRAVENOUS | Status: AC
  Administered 2016-07-15: 1.25 mg via INTRAVENOUS
  Filled 2016-07-15: qty 1

## 2016-07-15 MED ORDER — OXYCODONE-ACETAMINOPHEN 5-325 MG PO TABS
1.0000 | ORAL_TABLET | ORAL | Status: DC | PRN
  Administered 2016-07-15 – 2016-07-17 (×4): 1 via ORAL
  Filled 2016-07-15 (×4): qty 1

## 2016-07-15 NOTE — Progress Notes (Signed)
Patient ID: Carolyn KernsJennifer Sharp, female   DOB: 08/12/1966, 50 y.o.   MRN: [id number] Subjective: Patient reports more headache today, no NTW, some Nausea  Objective: Vital signs in last 24 hours: Temp:  [97.5 F (36.4 C)-99 F (37.2 C)] 98.9 F (37.2 C) (11/12 0800) Pulse Rate:  [50-94] 61 (11/12 0800) Resp:  [11-24] 14 (11/12 0800) BP: (139-166)/(73-96) 151/91 (11/12 0800) SpO2:  [92 %-100 %] 98 % (11/12 0800)  Intake/Output from previous day: 11/11 0701 - 11/12 0700 In: 2880 [P.O.:480; I.V.:2400] Out: 3800 [Urine:3800] Intake/Output this shift: Total I/O In: 100 [I.V.:100] Out: -   Neurologic: Grossly normal. awake alert and pleasant and conversant, no aphasia and moving all extremities, extra ocular movements are intact, no facial asymmetry RRR, resps unlabored  Lab Results: Lab Results  Component Value Date   WBC 10.6 (H) 07/12/2016   HGB 13.5 07/12/2016   HCT 39.1 07/12/2016   MCV 94.2 07/12/2016   PLT 194 07/12/2016   Lab Results  Component Value Date   INR 1.00 07/12/2016   BMET Lab Results  Component Value Date   NA 139 07/12/2016   K 3.2 (L) 07/12/2016   CL 104 07/12/2016   CO2 22 07/12/2016   GLUCOSE 156 (H) 07/12/2016   BUN 15 07/12/2016   CREATININE 0.92 07/12/2016   CALCIUM 10.1 07/12/2016    Studies/Results: No results found.  Assessment/Plan: Doing ok, some more headache today, but looks good clinically still so no CT head yet   LOS: 3 days    Carolyn Sharp S 07/15/2016, 10:04 AM

## 2016-07-16 ENCOUNTER — Inpatient Hospital Stay (HOSPITAL_COMMUNITY): Payer: BLUE CROSS/BLUE SHIELD

## 2016-07-16 DIAGNOSIS — I609 Nontraumatic subarachnoid hemorrhage, unspecified: Secondary | ICD-10-CM

## 2016-07-16 MED ORDER — CLONIDINE HCL 0.1 MG PO TABS
0.1000 mg | ORAL_TABLET | Freq: Two times a day (BID) | ORAL | Status: DC | PRN
  Administered 2016-07-16 – 2016-07-20 (×4): 0.1 mg via ORAL
  Filled 2016-07-16 (×4): qty 1

## 2016-07-16 NOTE — Evaluation (Signed)
Occupational Therapy Evaluation Patient Details Name: Carolyn KernsJennifer Bartha MRN: [id number] DOB: 08/14/1966 Today's Date: 07/16/2016    History of Present Illness pt presents with sudden onset headache with nausea and vomiting.  pt found to have Bil SAH symmetrically around brainstem with early hydrocephalus.  pt with hx of HTN.     Clinical Impression   Patient evaluated by Occupational Therapy with no further acute OT needs identified. All education has been completed and the patient has no further questions. See below for any follow-up Occupational Therapy or equipment needs. OT to sign off. Thank you for referral.      Follow Up Recommendations  No OT follow up    Equipment Recommendations  None recommended by OT    Recommendations for Other Services       Precautions / Restrictions Precautions Precautions: None      Mobility Bed Mobility Overal bed mobility: Independent                Transfers                      Balance                                            ADL Overall ADL's : Independent                                       General ADL Comments: Pt completed the MOCA and scored 29/30 on test. Pt reports having STM deficits prior to admission and pt shows deficits with delayed recall. pt advised to make sure to write things down and use calendar. Pt reports this is her normal before admission and she always writes task down. pt could benefit from highlighted dc summary with repetition from RN staff to ensure patient is able to recall instructions     Vision Vision Assessment?: No apparent visual deficits   Perception     Praxis      Pertinent Vitals/Pain Pain Assessment: 0-10 Pain Score: 2  Pain Location: head Pain Descriptors / Indicators: Headache Pain Intervention(s): Monitored during session     Hand Dominance Right   Extremity/Trunk Assessment Upper Extremity Assessment Upper  Extremity Assessment: Overall WFL for tasks assessed   Lower Extremity Assessment Lower Extremity Assessment: Overall WFL for tasks assessed   Cervical / Trunk Assessment Cervical / Trunk Assessment: Normal   Communication Communication Communication: No difficulties   Cognition Arousal/Alertness: Awake/alert Behavior During Therapy: WFL for tasks assessed/performed Overall Cognitive Status: Within Functional Limits for tasks assessed                     General Comments       Exercises       Shoulder Instructions      Home Living Family/patient expects to be discharged to:: Private residence Living Arrangements: Spouse/significant other Available Help at Discharge: Family;Available PRN/intermittently Type of Home: House Home Access: Stairs to enter     Home Layout: Two level;Able to live on main level with bedroom/bathroom     Bathroom Shower/Tub: Tub/shower unit         Home Equipment: None          Prior Functioning/Environment Level of Independence: Independent  OT Problem List:     OT Treatment/Interventions:      OT Goals(Current goals can be found in the care plan section)    OT Frequency:     Barriers to D/C:            Co-evaluation              End of Session Nurse Communication: Mobility status;Precautions  Activity Tolerance: Patient tolerated treatment well Patient left: in bed;with call bell/phone within reach;with bed alarm set   Time: 1030-1045 OT Time Calculation (min): 15 min Charges:  OT General Charges $OT Visit: 1 Procedure OT Evaluation $OT Eval Low Complexity: 1 Procedure G-Codes:    Boone MasterJones, Destane Speas B 07/16/2016, 11:03 AM   Mateo FlowJones, Brynn   OTR/L Pager: [phone]: [phone] Office: [phone] .

## 2016-07-16 NOTE — Progress Notes (Signed)
Transcranial Doppler  Date POD PCO2 HCT BP  MCA ACA PCA OPHT SIPH VERT Basilar  11//10 RDS/MS    131/72 Right  Left   52  63   -47  -21   55  27   21  18   31   *   -33  -20   -34      11/13 RDS/JE     Right  Left   44  44   -42  -46   23  30   15  26   26   71   -36  -26   -14           Right  Left                                             Right  Left                                             Right  Left                                            Right  Left                                            Right  Left                                        MCA = Middle Cerebral Artery      OPHT = Opthalmic Artery     BASILAR = Basilar Artery   ACA = Anterior Cerebral Artery     SIPH = Carotid Siphon PCA = Posterior Cerebral Artery   VERT = Verterbral Artery                   Normal MCA = 62+\-12 ACA = 50+\-12 PCA = 42+\-23   07/13/16-*- unable to insonate  right  Lindegaard ratio = 1.2, left = 2.3    07/16/16 right Lindegaard ratio= 1.2.  Left 1.7

## 2016-07-16 NOTE — Progress Notes (Signed)
Patient ID: Carolyn KernsJennifer Sharp, female   DOB: 12/15/1965, 50 y.o.   MRN: [id number] Alert and oriented x 4, speech is clear and fluent Moving all extremities well No drift, perrl, full eom Started clonidine for BP

## 2016-07-16 NOTE — Progress Notes (Signed)
BP 185/82, pt denies any discomfort. Nimotop given, if no results seen in 30 min will page on call physician.

## 2016-07-17 ENCOUNTER — Other Ambulatory Visit (HOSPITAL_COMMUNITY): Payer: BLUE CROSS/BLUE SHIELD

## 2016-07-17 NOTE — Progress Notes (Signed)
Patient ID: [name], female   DOB: 02/09/1966, 50 y.o.   MRN: [id number] BP (!) 165/84   Pulse 67   Temp 98.5 F (36.9 C) (Oral)   Resp 14   Ht 5\' 2"  (1.575 m)   Wt 63.6 kg (140 lb 3.4 oz)   SpO2 99%   BMI 25.65 kg/m  Alert and oriented x 4, speech is clear and fluent Moving all extremities well Symmetric facies, symmetric facial sensation.  Doing very well.  Will repeat angio end of the week .

## 2016-07-17 NOTE — Care Management Note (Signed)
Case Management Note  Patient Details  Name: Carolyn Sharp MRN: [id number] Date of Birth: 08/16/1966  Subjective/Objective:  Pt admitted on 07/12/16 s/p SAH.  PTA, pt independent, lives with spouse.                Action/Plan: Will follow for discharge planning as pt progresses.   Expected Discharge Date:                  Expected Discharge Plan:  Home/Self Care  In-House Referral:     Discharge planning Services  CM Consult  Post Acute Care Choice:    Choice offered to:     DME Arranged:    DME Agency:     HH Arranged:    HH Agency:     Status of Service:  In process, will continue to follow  If discussed at Long Length of Stay Meetings, dates discussed:    Additional Comments:  Glennon Macmerson, Nakina Spatz M, RN 07/17/2016, 4:40 PM

## 2016-07-18 ENCOUNTER — Inpatient Hospital Stay (HOSPITAL_COMMUNITY): Payer: BLUE CROSS/BLUE SHIELD

## 2016-07-18 DIAGNOSIS — I609 Nontraumatic subarachnoid hemorrhage, unspecified: Secondary | ICD-10-CM

## 2016-07-18 MED ORDER — PROCHLORPERAZINE 25 MG RE SUPP
25.0000 mg | Freq: Two times a day (BID) | RECTAL | Status: DC | PRN
  Administered 2016-07-18: 25 mg via RECTAL
  Filled 2016-07-18 (×2): qty 1

## 2016-07-18 NOTE — Plan of Care (Signed)
Problem: Education: Goal: Knowledge of disease or condition will improve Outcome: Completed/Met Date Met: 07/18/16 Pt is aware of the need to monitor BP and S/S of [facility]

## 2016-07-18 NOTE — Progress Notes (Signed)
Transcranial Doppler  Date POD PCO2 HCT BP  MCA ACA PCA OPHT SIPH VERT Basilar  11//10 RDS/MS    131/72 Right  Left   52  63   -47  -21   55  27   21  18   31   *   -33  -20   -34      11/13 RDS/JE     Right  Left   44  44   -42  -46   23  30   15  26   26   71   -36  -26   -14      11/15 RS/JE    164/90 Right  Left   61  45   -66  -31   40  28   23  24    -47  109   -55  -46   -35            Right  Left                                             Right  Left                                            Right  Left                                            Right  Left                                        MCA = Middle Cerebral Artery      OPHT = Opthalmic Artery     BASILAR = Basilar Artery   ACA = Anterior Cerebral Artery     SIPH = Carotid Siphon PCA = Posterior Cerebral Artery   VERT = Verterbral Artery                   Normal MCA = 62+\-12 ACA = 50+\-12 PCA = 42+\-23   07/13/16-*- unable to insonate  right  Lindegaard ratio = 1.2, left = 2.3    07/16/16 right Lindegaard ratio= 1.2.  Left 1.7

## 2016-07-18 NOTE — Progress Notes (Signed)
Patient ID: Carolyn KernsJennifer Sharp, female   DOB: 04/04/1966, 50 y.o.   MRN: [id number] BP (!) 187/90   Pulse (!) 59   Temp 98.7 F (37.1 C) (Axillary)   Resp 14   Ht 5\' 2"  (1.575 m)   Wt 63.6 kg (140 lb 3.4 oz)   SpO2 96%   BMI 25.65 kg/m  Alert and oriented x 4, speech is clear and fluent Perrl, full eom Tongue and uvula midline, no drift Did have emesis just after lunch today.  No neurologic changes. Remain in the Unit.

## 2016-07-19 ENCOUNTER — Inpatient Hospital Stay (HOSPITAL_COMMUNITY): Payer: BLUE CROSS/BLUE SHIELD

## 2016-07-19 HISTORY — PX: IR GENERIC HISTORICAL: IMG1180011

## 2016-07-19 MED ORDER — MIDAZOLAM HCL 2 MG/2ML IJ SOLN
INTRAMUSCULAR | Status: AC
  Filled 2016-07-19: qty 2

## 2016-07-19 MED ORDER — IOPAMIDOL (ISOVUE-300) INJECTION 61%
INTRAVENOUS | Status: AC
  Administered 2016-07-19: 45 mL
  Filled 2016-07-19: qty 100

## 2016-07-19 MED ORDER — MIDAZOLAM HCL 2 MG/2ML IJ SOLN
INTRAMUSCULAR | Status: AC | PRN
  Administered 2016-07-19: 1 mg via INTRAVENOUS

## 2016-07-19 MED ORDER — HEPARIN SODIUM (PORCINE) 1000 UNIT/ML IJ SOLN
INTRAMUSCULAR | Status: AC
  Filled 2016-07-19: qty 1

## 2016-07-19 MED ORDER — LIDOCAINE HCL 1 % IJ SOLN
INTRAMUSCULAR | Status: AC
  Administered 2016-07-19: 7 mL via SUBCUTANEOUS
  Filled 2016-07-19: qty 20

## 2016-07-19 MED ORDER — FENTANYL CITRATE (PF) 100 MCG/2ML IJ SOLN
INTRAMUSCULAR | Status: AC | PRN
  Administered 2016-07-19: 25 ug via INTRAVENOUS

## 2016-07-19 MED ORDER — FENTANYL CITRATE (PF) 100 MCG/2ML IJ SOLN
INTRAMUSCULAR | Status: AC
  Filled 2016-07-19: qty 2

## 2016-07-19 NOTE — Sedation Documentation (Signed)
Patient is resting comfortably. 

## 2016-07-19 NOTE — Progress Notes (Signed)
Patient ID: Carolyn Sharp, female   DOB: 05/04/1966, 50 y.o.   MRN: [id number] BP 140/74   Pulse 90   Temp 98.1 F (36.7 C) (Oral)   Resp 20   Ht 5\' 2"  (1.575 m)   Wt 63.6 kg (140 lb 3.4 oz)   SpO2 98%   BMI 25.65 kg/m  Alert and oriented x 4, speech is clear and fluent Moving all extremities well Has a headache tonight, no emesis today Repeat angio was clean by initial cursory reading. Just waiting for her to feel better.

## 2016-07-19 NOTE — Sedation Documentation (Signed)
exoseal to R groin intact- dsg intact

## 2016-07-19 NOTE — Sedation Documentation (Signed)
IR tech placing exoseal and will hold pressure to R groin 

## 2016-07-19 NOTE — Progress Notes (Signed)
No issues overnight. Pt has less nausea today, improved HA.  EXAM:  BP (!) 148/82   Pulse 68   Temp 98.4 F (36.9 C) (Oral)   Resp 15   Ht 5\' 2"  (1.575 m)   Wt 63.6 kg (140 lb 3.4 oz)   SpO2 97%   BMI 25.65 kg/m   Awake, alert, oriented  Speech fluent, appropriate  CN grossly intact  5/5 BUE/BLE   IMPRESSION:  50 y.o. female SAH d# 7, angio neg. Neurologically intact  PLAN: - Repeat angio today  I have reviewed the plan with the patient. All questions were answered.

## 2016-07-20 ENCOUNTER — Inpatient Hospital Stay (HOSPITAL_COMMUNITY): Payer: BLUE CROSS/BLUE SHIELD

## 2016-07-20 DIAGNOSIS — I609 Nontraumatic subarachnoid hemorrhage, unspecified: Secondary | ICD-10-CM

## 2016-07-20 MED ORDER — AMLODIPINE BESYLATE 5 MG PO TABS: 5.0000 mg | ORAL_TABLET | Freq: Every day | ORAL | 0 refills | Status: AC

## 2016-07-20 MED ORDER — CLONIDINE HCL 0.1 MG PO TABS: 0.1000 mg | ORAL_TABLET | Freq: Once | ORAL | Status: DC

## 2016-07-20 MED ORDER — AMLODIPINE BESYLATE 5 MG PO TABS
5.0000 mg | ORAL_TABLET | Freq: Every day | ORAL | Status: DC
  Administered 2016-07-20 – 2016-07-21 (×2): 5 mg via ORAL
  Filled 2016-07-20 (×2): qty 1

## 2016-07-20 MED ORDER — TRAMADOL HCL 50 MG PO TABS: 50.0000 mg | ORAL_TABLET | Freq: Four times a day (QID) | ORAL | 0 refills | Status: AC | PRN

## 2016-07-20 NOTE — Progress Notes (Signed)
Patient vomiting. States " I can taste the big pills. It's making me wretch." Dr. Franky Machoabbell made aware. Patient taken for STAT head CT. Continuing to monitor.

## 2016-07-20 NOTE — Discharge Summary (Signed)
Physician Discharge Summary  Patient ID: Carolyn Sharp MRN: [id number] DOB/AGE: 52/05/1966 52 y.o.  Admit date: 07/12/2016 Discharge date: 07/20/2016  Admission Diagnoses:Subarachnoid hemorrhage non aneurysmal  Discharge Diagnoses:  Active Problems:   Subarachnoid hemorrhage [facility](HCC)   Discharged Condition: good  Hospital Course: Carolyn Sharp was admitted on 07/12/16 after sustaining a subarachnoid hemorrhage. She was grade 1 on the Hunt-Hess scale. Her exam did not deteriorate during her hospitalization. Carolyn Sharp had a normal exam on admission. Secondary to the amount of blood present a 4 vessel angiogram was performed which was normal. Carolyn Sharp convalesced in the neuro ICU and had little other than a headache. Her examination remained normal. TCD's were within normal limits.  She was discharged after another cerebral catheter angiogram was normal.   Treatments: cerebral angiogram x 2  Discharge Exam: Blood pressure (!) 176/82, pulse (!) 55, temperature 98.1 F (36.7 C), temperature source Oral, resp. rate 16, height 5\' 2"  (1.575 m), weight 63.6 kg (140 lb 3.4 oz), SpO2 97 %. General appearance: alert, cooperative, appears stated age and no distress Neurologic: Alert and oriented X 3, normal strength and tone. Normal symmetric reflexes. Normal coordination and gait  Disposition: Final discharge disposition not confirmed sub. bleed    Medication List    TAKE these medications   acetaminophen 325 MG tablet Commonly known as:  TYLENOL Take 650 mg by mouth every 6 (six) hours as needed (For pain.).   amLODipine 5 MG tablet Commonly known as:  NORVASC Take 1 tablet (5 mg total) by mouth daily. Start taking on:  07/21/2016   losartan-hydrochlorothiazide 100-12.5 MG tablet Commonly known as:  HYZAAR Take 1 tablet by mouth daily.   traMADol 50 MG tablet Commonly known as:  ULTRAM Take 1 tablet (50 mg total) by mouth every 6 (six) hours as needed.      Follow-up  Information    Naseer Hearn L, MD Follow up in 2 week(s).   Specialty:  Neurosurgery Why:  please call the office to make an appointment Contact information: [address] [place] [address] [phone]           Signed: Carmela HurtCABBELL,Kylar Speelman L 07/20/2016, 6:37 PM

## 2016-07-20 NOTE — Progress Notes (Signed)
Dr. Franky Machoabbell at bedside and aware patient BP 176/82 with MAP 111. Per MD, as long as DBP<100 and patient asymptomatic he is ok with it. Patient will follow-up with primary care next week. Nursing to continue to monitor.

## 2016-07-20 NOTE — Progress Notes (Signed)
Patient ID: [name], female   DOB: 01/27/1966, 50 y.o.   MRN: [id number]  BP 136/78   Pulse 80   Temp 98.1 F (36.7 C) (Oral)   Resp 16   Ht 5\' 2"  (1.575 m)   Wt 63.6 kg (140 lb 3.4 oz)   SpO2 98%   BMI 25.65 kg/m  Subarachnoid hemorrhage is resolved, no hydrocephalus.  Will monitor closely.

## 2016-07-20 NOTE — Progress Notes (Signed)
Patient ID: Carolyn KernsJennifer Bordenave, female   DOB: 10/11/1965, 50 y.o.   MRN: [id number] BP (!) 176/82   Pulse (!) 55   Temp 98.1 F (36.7 C) (Oral)   Resp 16   Ht 5\' 2"  (1.575 m)   Wt 63.6 kg (140 lb 3.4 oz)   SpO2 97%   BMI 25.65 kg/m  Had been improving today, but blood pressure trending high. Called her primary care physician and started amlodipine in anticipation of discharge tomorrow. However tonight she had another episode of emesis, and the blood pressure remains high. Will obtain head CT now.  Exam perrl, full eom Symmetric facies Tongue and uvula are midline Moving all extremities well No drift

## 2016-07-20 NOTE — Progress Notes (Signed)
Dr. Franky Machoabbell made aware patient SBP 170's despite PRN clonidine. Orders received. Continuing to monitor.

## 2016-07-20 NOTE — Progress Notes (Signed)
Transcranial Doppler  Date POD PCO2 HCT BP  MCA ACA PCA OPHT SIPH VERT Basilar  11//10 RDS/MS    131/72 Right  Left   52  63   -47  -21   55  27   21  18   31   *   -33  -20   -34      11/13 RDS/JE     Right  Left   44  44   -42  -46   23  30   15  26   26   71   -36  -26   -14      11/15 RS/JE    164/90 Right  Left   61  45   -66  -31   40  28   23  24    -47  109   -55  -46   -35      11/17 JE      Right  Left   49  53   -48  -28   44  53   14  20   37  -40   -25  -30   -55            Right  Left                                            Right  Left                                            Right  Left                                        MCA = Middle Cerebral Artery      OPHT = Opthalmic Artery     BASILAR = Basilar Artery   ACA = Anterior Cerebral Artery     SIPH = Carotid Siphon PCA = Posterior Cerebral Artery   VERT = Verterbral Artery                   Normal MCA = 62+\-12 ACA = 50+\-12 PCA = 42+\-23   07/13/16-*- unable to insonate  right  Lindegaard ratio = 1.2, left = 2.3    07/16/16 right Lindegaard ratio= 1.2.  Left 1.7

## 2016-07-20 NOTE — Progress Notes (Signed)
Dr. Franky Machoabbell at bedside and updates. Aware patient has been SB at times with HR as low as 50. Current SBP 160s. Orders received and carried out. Continuing to monitor.

## 2016-07-20 NOTE — Discharge Instructions (Signed)
Subarachnoid Hemorrhage Subarachnoid hemorrhage is bleeding in the area between the brain and the membrane that covers the brain. The bleeding puts more pressure on the brain and stops blood from reaching some areas of the brain. It is very serious. It may cause brain damage, stroke, or death if not treated. You must be treated in the hospital right away. What increases the risk? You may be more likely to have this condition if you:  Smoke.  Have high blood pressure (hypertension).  Drink too much alcohol.  Are a female, especially after menopause.  Have a family history of disease in the blood vessels of the brain.  Have a certain inherited kidney disease or connective tissue disease. What are the signs or symptoms?  Having a sudden, severe headache.  Feeling sick to your stomach (nauseous) or throwing up (vomiting) combined with other problems.  Suddenly feeling weak.  Losing feeling on your face, arm, or leg, especially on one side of the body.  Suddenly having trouble walking or moving your arms or legs.  Suddenly feeling confused.  Suddenly having a change in mood or personality.  Having trouble talking or understanding.  Having trouble swallowing.  Suddenly having trouble seeing.  Seeing double.  Feeling dizzy.  Losing your balance or coordination.  Having light bother or hurt your eyes.  Having a stiff neck. Follow these instructions at home:  Take all medicines exactly as told by your doctor.  Eat healthy foods if you can swallow.  Eat foods that are low in salt and cholesterol.  Eat foods that are low in saturated and trans fat.  If told, eat soft or pureed foods so that you do not choke.  If told, take small bites of food so that you do not choke.  Rest as told by your doctor.  Limit your activity as told by your doctor.  Do not smoke.  Limit how much alcohol you drink.  Men-drink no more than 2 drinks a day.  Women who are not  pregnant-drink no more than 1 drink a day.  Make changes to your lifestyle as told by your doctor.  Keep track of your blood pressure as told by your doctor.  Keep your home safe so you do not fall.  Put grab bars in the bedroom and bathroom.  Raise toilet seats.  Put a seat in the shower.  Go to therapy sessions as told by your doctor. This may include physical, occupational, and speech therapy.  Use a walker or cane at all times, if told to do so.  Keep all follow-up visits with your doctor and other specialists. Get help right away if:  You have a sudden, severe headache with no known cause.  You are sick to your stomach or throw up, and have another problem.  You have a sudden weakness.  You lose feeling on one side of your body.  You suddenly have trouble walking or moving arms or legs.  You suddenly feel confused.  You have trouble talking or understanding.  You suddenly have trouble seeing.  You lose your balance or your movements are not coordinated.  You have a stiff neck.  You have trouble breathing.  You are partly or totally unaware of what is going on around you. The symptoms above may be a sign of a serious problem that is an emergency. Do not wait to see if the symptoms will go away. Get medical help right away. Call your local emergency services (911 in [place]  Do not drive yourself to the hospital.  This information is not intended to replace advice given to you by your health care provider. Make sure you discuss any questions you have with your health care provider. Document Released: 12/15/2012 Document Revised: 01/26/2016 Document Reviewed: 10/03/2012 Elsevier Interactive Patient Education  2017 [employer].

## 2016-07-21 NOTE — Progress Notes (Signed)
Patient given discharge paperwork. IV removed. Morning medications given.

## 2016-07-21 NOTE — Discharge Summary (Signed)
Date of Admission: 07/12/2016  Date of Discharge: 07/21/16  Admission Diagnosis: Non aneurysmal subarachnoid hemorrhage  Discharge Diagnosis: Same  Procedure Performed: Cerebral angiogram  Attending: Coletta MemosKyle Cabbell, MD  Hospital Course:  The patient was admitted for the above condition.  She was observed and remained neurologically normal.  Catheter angiography did not reveal a source of the hemorrhage.  She is discharged home in stable condition.  Follow up: 3 weeks    Medication List    TAKE these medications   acetaminophen 325 MG tablet Commonly known as:  TYLENOL Take 650 mg by mouth every 6 (six) hours as needed (For pain.).   amLODipine 5 MG tablet Commonly known as:  NORVASC Take 1 tablet (5 mg total) by mouth daily.   losartan-hydrochlorothiazide 100-12.5 MG tablet Commonly known as:  HYZAAR Take 1 tablet by mouth daily.   traMADol 50 MG tablet Commonly known as:  ULTRAM Take 1 tablet (50 mg total) by mouth every 6 (six) hours as needed.

## 2016-07-21 NOTE — Progress Notes (Signed)
No acute events Awake, alert, oriented Moving all extremities well Stable D/c today

## 2016-07-24 ENCOUNTER — Encounter (HOSPITAL_COMMUNITY): Payer: Self-pay | Admitting: Neurosurgery

## 2016-07-25 ENCOUNTER — Encounter (HOSPITAL_COMMUNITY): Payer: Self-pay | Admitting: Radiology

## 2016-07-25 ENCOUNTER — Other Ambulatory Visit: Payer: Self-pay

## 2016-07-25 DIAGNOSIS — I10 Essential (primary) hypertension: Secondary | ICD-10-CM

## 2016-07-25 NOTE — Patient Outreach (Signed)
[facility]) Care Management  07/25/2016  Sherren KernsJennifer Kaestner 06/21/1966 [id number]   [facility] Stroke Program   Referral Date:  07/25/2016 Source:  [facility]  Issue:  Red Alerts -Scheduled a follow-up appt? No -filled new prescriptions? No  [facility] CM eligible:  No  Subjective: Outreach call #1 to patient. Patient reached and completed call.  [address]  [place] [id number] Patient states "I did not have a stroke. I had a subarachnoid hemorrhage."  Providers:  PCP:  Jarrett Sohoourtney Wharton PA-C  Last appt 07/24/2016 Neurologist:  Dr. Coletta MemosKyle Cabbell, MD  Next appt 08/07/2016  Patient states she drives self to appointments.   Co-morbidities:  HTN, Non aneurysmal subarachnoid hemorrhage (07/12/16) Admission:  07/12/16 - 07/21/16  Non aneurysmal subarachnoid hemorrhage  Dr. Coletta MemosKyle Cabbell, MD H/o catheter angiography but no source of hemorrhage. Follow-up: 3 weeks:  appt scheduled for 08/07/2016  BP 153/84 07/12/2016 Weight 140 lb (64 kg) 07/12/2016 Height 62 in (157 cm) 07/12/2016 BMI 25.70 (Overweight, Pre-obe 07/12/2016  Lipid Panel completed 07/14/2015 HDL 94.000 07/14/2015 LDL 086.578146.000 07/14/2015 Cholesterol, total 256.000 07/14/2015 Triglycerides 77.000 07/14/2015 A1C 5.900 03/21/2016 Glucose Random 156.000 07/12/2016  Medications:   Patient states she has all her medications and "must have hit the wrong button"  Prevnar (PCV13) N/D Pneumovax (PPS N/D Flu Vaccine N/D tDAP Vaccine 07/12/2014  Encounter Medications:  Outpatient Encounter Prescriptions as of 07/25/2016  Medication Sig  . acetaminophen (TYLENOL) 325 MG tablet Take 650 mg by mouth every 6 (six) hours as needed (For pain.).  Marland Kitchen. amLODipine (NORVASC) 5 MG tablet Take 1 tablet (5 mg total) by mouth daily.  Marland Kitchen. losartan-hydrochlorothiazide (HYZAAR) 100-12.5 MG tablet Take 1 tablet by mouth daily.  . traMADol (ULTRAM) 50 MG tablet Take 1 tablet (50 mg total) by mouth every 6 (six) hours as needed.   No  facility-administered encounter medications on file as of 07/25/2016.     Functional Status:  In your present state of health, do you have any difficulty performing the following activities: 07/12/2016  Hearing? N  Vision? N  Difficulty concentrating or making decisions? N  Walking or climbing stairs? N  Dressing or bathing? N  Doing errands, shopping? N  Some recent data might be hidden    Fall/Depression Screening: PHQ 2/9 Scores 07/25/2016  PHQ - 2 Score 0   Fall Risk  07/25/2016  Falls in the past year? No    Preventives: Colonoscopy 09/23/2014 Mammogram 08/03/2015 Tobacco (smoker): Never Used 07/13/2016   Assessment/Plan: Referral:  07/25/2016 [facility] Stroke Program:  07/25/2016 [facility] eligible:  No  Patient has no issues, questions, concerns.  Patient has all medications.  Has completed f/u appt and scheduled Neurology appt.   RN CM will follow-up with next contact call within one week.   Simmie Daviesrystal Batul Diego, MSHL, BSN, RN, CCM  [facility] Care Management Care Management Coordinator [phone] Direct [phone] Cell [phone] Office [phone] Fax [email or web address]

## 2016-08-01 ENCOUNTER — Ambulatory Visit: Payer: Self-pay

## 2016-08-02 ENCOUNTER — Ambulatory Visit
Admission: RE | Admit: 2016-08-02 | Discharge: 2016-08-02 | Disposition: A | Discharge status: Home / Self Care | Payer: BLUE CROSS/BLUE SHIELD | Source: Ambulatory Visit | Attending: Family Medicine | Admitting: Family Medicine

## 2016-08-02 DIAGNOSIS — Z1231 Encounter for screening mammogram for malignant neoplasm of breast: Secondary | ICD-10-CM

## 2016-08-03 ENCOUNTER — Encounter (HOSPITAL_COMMUNITY): Payer: Self-pay | Admitting: Neurosurgery

## 2016-08-03 ENCOUNTER — Ambulatory Visit: Payer: Self-pay

## 2016-08-06 ENCOUNTER — Ambulatory Visit: Payer: Self-pay

## 2016-08-07 ENCOUNTER — Ambulatory Visit: Payer: Self-pay

## 2016-08-08 ENCOUNTER — Other Ambulatory Visit: Payer: Self-pay

## 2016-08-08 NOTE — Patient Outreach (Addendum)
[facility] Care Management  08/08/2016  Carolyn KernsJennifer Sharp 07/10/1966 [id number]   Carolyn Sharp  Start date: 07/25/2016 Issue:  Red Alert:  Carolyn FlesherWent to follow-up appt? no [facility] CM eligible:  No  Subjective: Outreach call #1 to patient. RN CM left message and patient called back.  [address]  [place] [id number] [phone](H) [phone] (M)  MR Review:  H/o Providers:  PCP:  Jarrett Sohoourtney Wharton PA-C  Last appt 07/24/2016 Neurologist:  Dr. Coletta MemosKyle Cabbell, MD  Next appt 08/07/2016 - patient verified she kept this appt and no problems identified.  States she is doing well and has no further needs.   [facility] but states no further CM needs.   Plan: RN CM advised patient may receive additional nurse calls should Carolyn signal a red alert.  RN CM encouraged to report any change in condition to MD.  Case closed to this Clinical research associatewriter.   Simmie Daviesrystal Avital Dancy, MSHL, BSN, RN, CCM   [facility] Care Management Care Management Coordinator [phone] Direct [phone] Cell [phone] Office [phone] Fax [email or web address]

## 2016-08-09 NOTE — Progress Notes (Signed)
This encounter was created in error - please disregard.

## 2016-08-09 NOTE — Addendum Note (Signed)
Addended by: Sharalyn InkOMER, Hilary Milks M on: 08/09/2016 04:03 PM   Modules accepted: Kipp BroodSmartSet

## 2016-09-25 ENCOUNTER — Other Ambulatory Visit (HOSPITAL_COMMUNITY)
Admission: RE | Admit: 2016-09-25 | Discharge: 2016-09-25 | Disposition: A | Discharge status: Home / Self Care | Payer: BLUE CROSS/BLUE SHIELD | Source: Ambulatory Visit | Attending: Family Medicine | Admitting: Family Medicine

## 2016-09-25 ENCOUNTER — Other Ambulatory Visit: Payer: Self-pay | Admitting: Family Medicine

## 2016-09-25 DIAGNOSIS — Z01419 Encounter for gynecological examination (general) (routine) without abnormal findings: Secondary | ICD-10-CM | POA: Diagnosis present

## 2016-09-25 DIAGNOSIS — Z1151 Encounter for screening for human papillomavirus (HPV): Secondary | ICD-10-CM | POA: Diagnosis present

## 2016-09-26 LAB — CYTOLOGY - PAP
DIAGNOSIS: NEGATIVE
HPV: NOT DETECTED

## 2017-07-02 ENCOUNTER — Other Ambulatory Visit: Payer: Self-pay | Admitting: Family Medicine

## 2017-07-02 DIAGNOSIS — Z1231 Encounter for screening mammogram for malignant neoplasm of breast: Secondary | ICD-10-CM

## 2017-07-18 IMAGING — CT CT HEAD W/O CM
3 of 4 series · 14 of 47 positions shown, 16 images · non-contrast
Comparison: None.

CLINICAL DATA: Acute onset headache with vomiting.

EXAM:
CT HEAD WITHOUT CONTRAST
TECHNIQUE: Contiguous axial images were obtained from the base of the skull
through the vertex without intravenous contrast.

[Series 2: head w/o · axial · non-contrast · 0.45mm/px · z∈[+1436,+1556]mm · 8 of 28 slices shown, 10 images]
[im 2/28  brain]
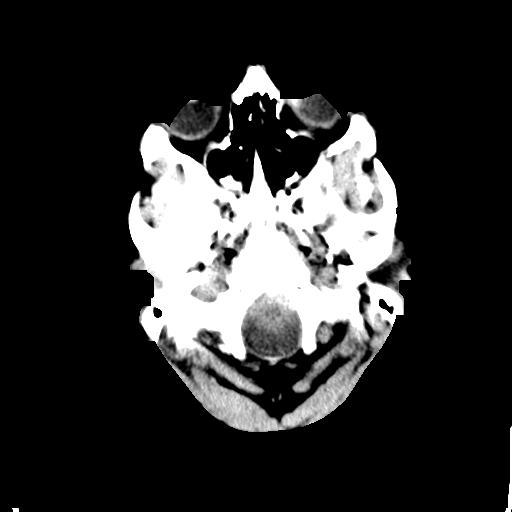
[im 2/28  bone]
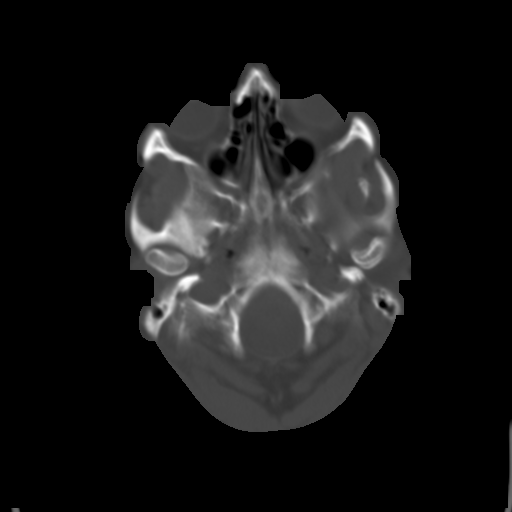
[im 6/28  brain]
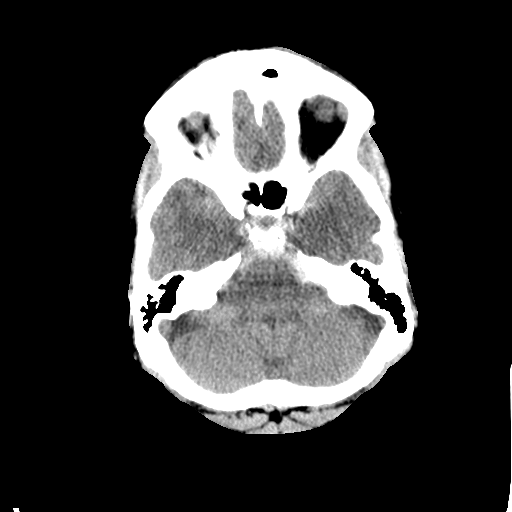
[im 10/28  brain]
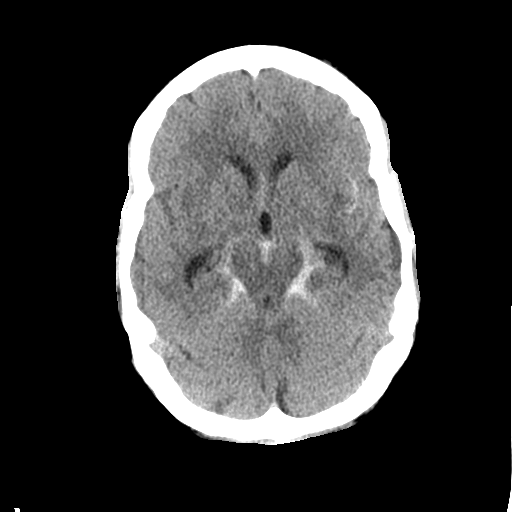
[im 12/28  brain]
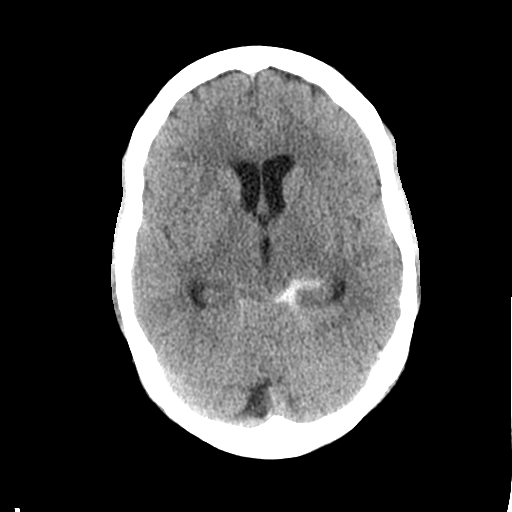
[im 16/28  brain]
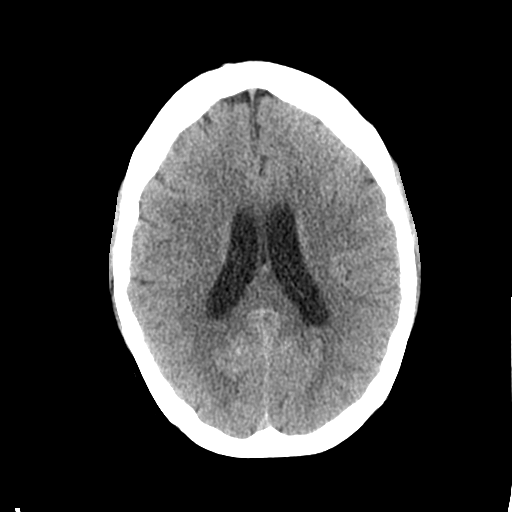
[im 16/28  bone]
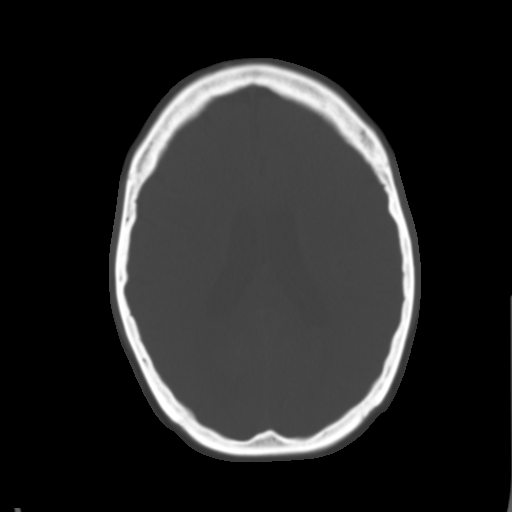
[im 18/28  brain]
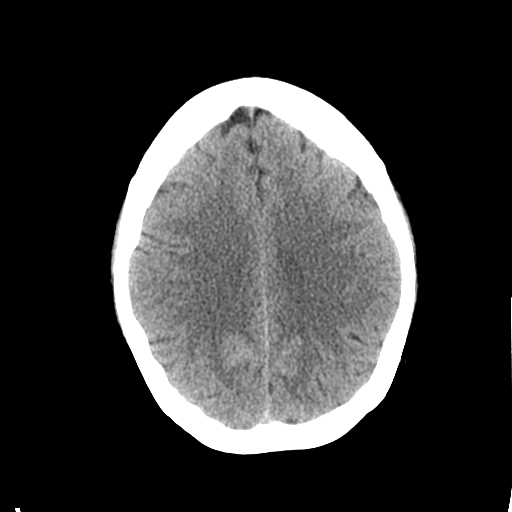
[im 22/28  brain]
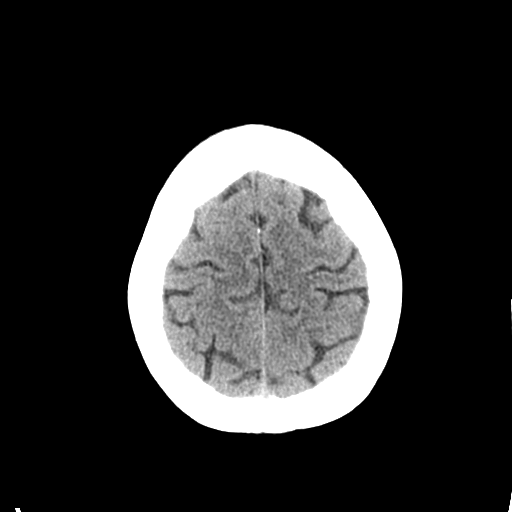
[im 26/28  brain]
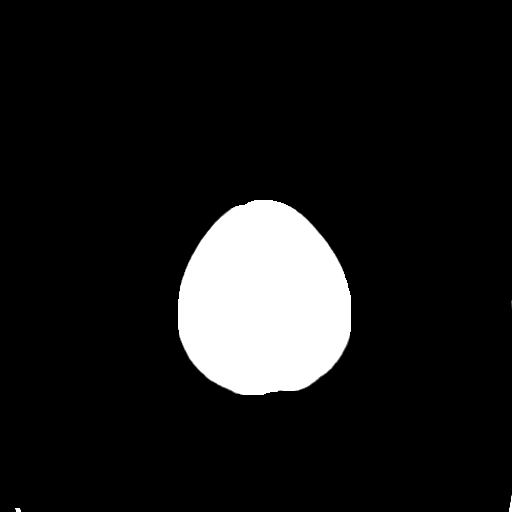

[Series 4: coronal · coronal · 0.30mm/px · 3 of 69 slices shown]
[im 23/69  brain]
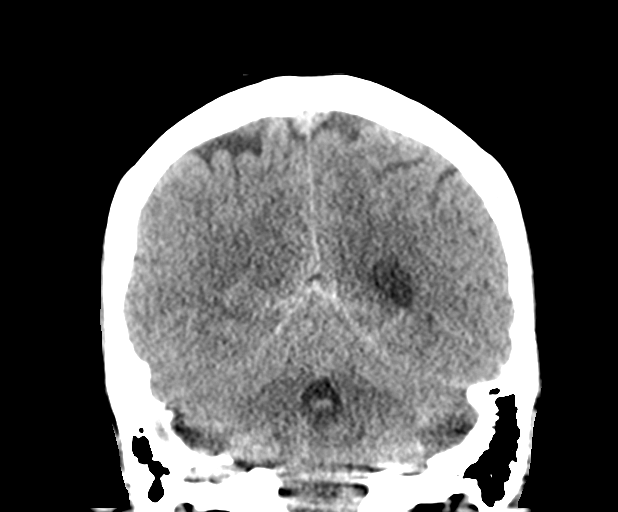
[im 31/69  brain]
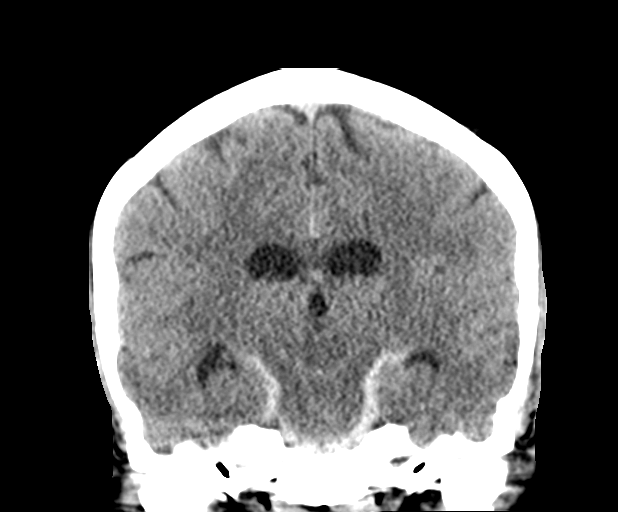
[im 38/69  brain]
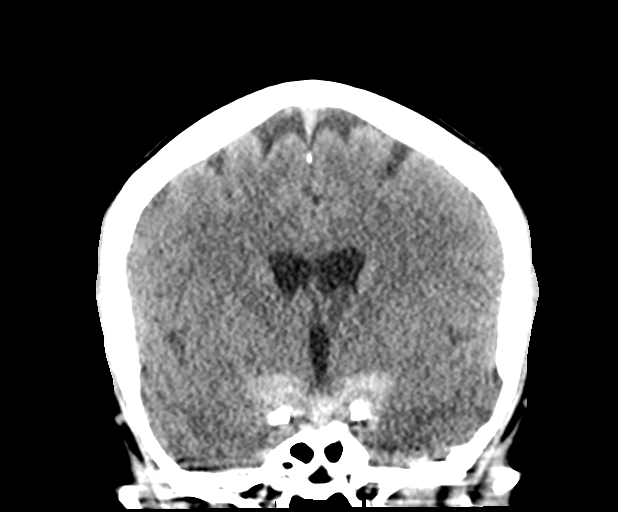

[Series 5: sagittal · sagittal · 0.26mm/px · 3 of 58 slices shown]
[im 20/58  brain]
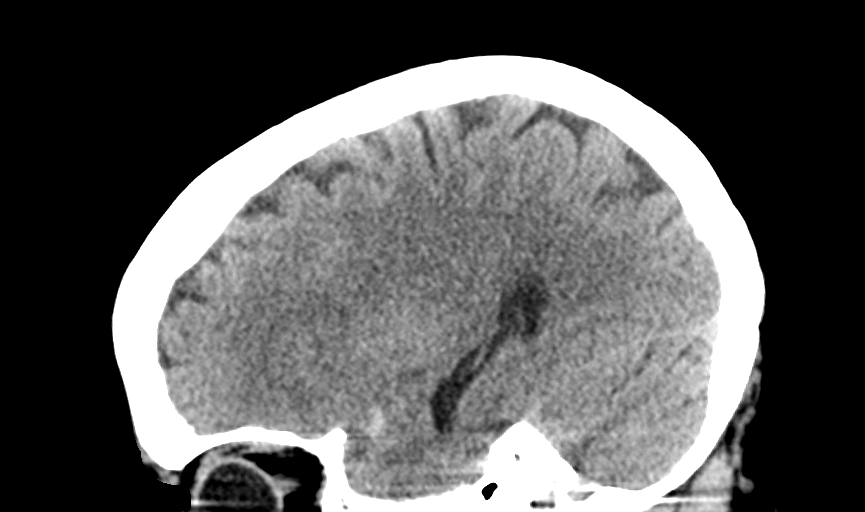
[im 29/58  brain]
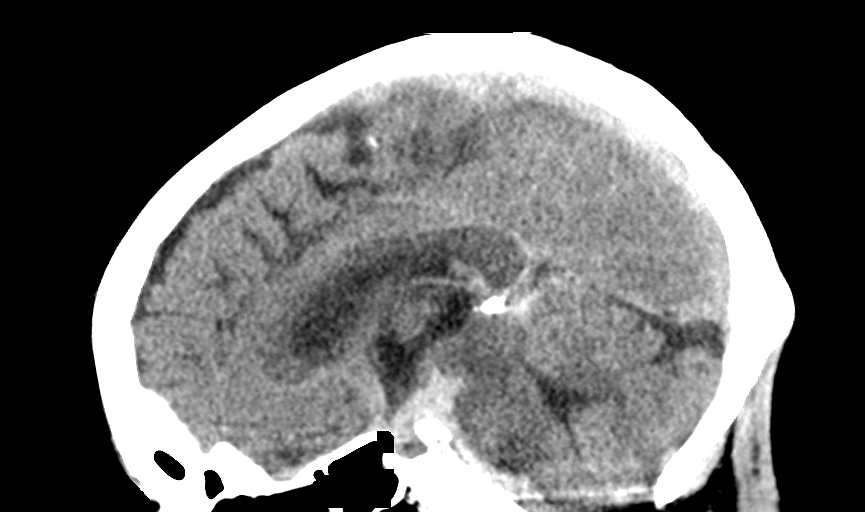
[im 39/58  brain]
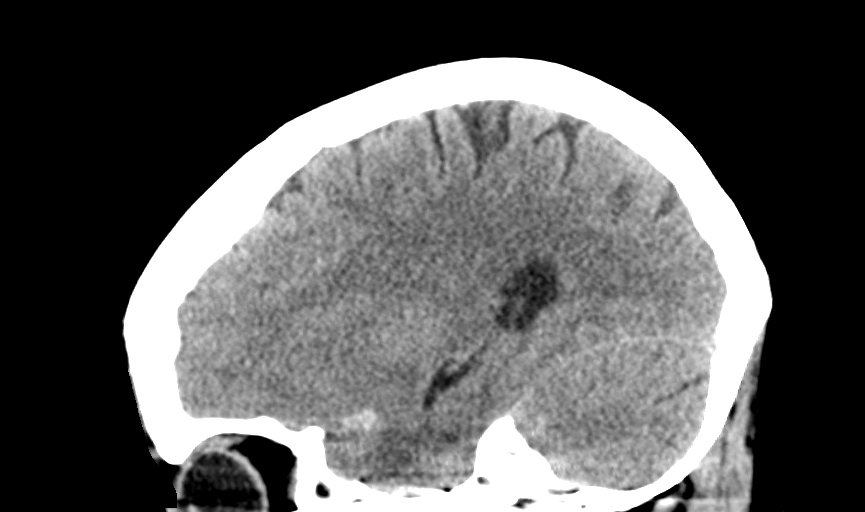

[14 of 47 positions shown; findings below may reference images not displayed]

FINDINGS: Brain: There is subarachnoid hemorrhage extending throughout the
suprasellar cistern region and into the surrounding cisternal
regions. A small amount of subarachnoid hemorrhage extends to the
left as far as the sylvian fissure. There is borderline ventricular
prominence. There is no midline shift. There is no subdural or
epidural fluid. There is no mass or edema evident.

Vascular: The location of the hemorrhage suggests aneurysm, possibly
arising from the basilar tip. Frank aneurysm is not delineated on
this noncontrast enhanced study.

Skull: The bony calvarium appears intact.

Sinuses/Orbits: Visualized paranasal sinuses are clear. Visualized
orbits appear symmetric bilaterally.

Other: Mastoid air cells clear.
IMPRESSION: Extensive subarachnoid hemorrhage. Suspect ruptured aneurysm,
possibly at the level of the basilar tip. Suspect early
hydrocephalus.

Critical Value/emergent results were called by telephone at the time
of interpretation on 07/12/2016 at [DATE] to Dr. KARIG SF , who
verbally acknowledged these results.

## 2017-08-05 ENCOUNTER — Ambulatory Visit
Admission: RE | Admit: 2017-08-05 | Discharge: 2017-08-05 | Disposition: A | Discharge status: Home / Self Care | Payer: BLUE CROSS/BLUE SHIELD | Source: Ambulatory Visit | Attending: Family Medicine | Admitting: Family Medicine

## 2017-08-05 DIAGNOSIS — Z1231 Encounter for screening mammogram for malignant neoplasm of breast: Secondary | ICD-10-CM
